# Patient Record
Sex: Male | Born: 1980 | Race: Black or African American | Hispanic: No | Marital: Single | Smoking: Current every day smoker
Health system: Southern US, Community
[De-identification: ages and names within clinical notes are randomized; demographics above are authoritative.]

## PROBLEM LIST (undated history)

## (undated) DIAGNOSIS — I509 Heart failure, unspecified: Secondary | ICD-10-CM

## (undated) DIAGNOSIS — I2510 Atherosclerotic heart disease of native coronary artery without angina pectoris: Secondary | ICD-10-CM

## (undated) DIAGNOSIS — J45909 Unspecified asthma, uncomplicated: Secondary | ICD-10-CM

## (undated) DIAGNOSIS — F1110 Opioid abuse, uncomplicated: Secondary | ICD-10-CM

## (undated) DIAGNOSIS — R0602 Shortness of breath: Secondary | ICD-10-CM

## (undated) DIAGNOSIS — R079 Chest pain, unspecified: Secondary | ICD-10-CM

## (undated) DIAGNOSIS — F1410 Cocaine abuse, uncomplicated: Secondary | ICD-10-CM

## (undated) DIAGNOSIS — Z720 Tobacco use: Secondary | ICD-10-CM

## (undated) HISTORY — DX: Shortness of breath: R06.02

## (undated) HISTORY — PX: HAND SURGERY: SHX662

## (undated) HISTORY — DX: Chest pain, unspecified: R07.9

## (undated) HISTORY — DX: Cocaine abuse, uncomplicated: F14.10

## (undated) HISTORY — DX: Heart failure, unspecified: I50.9

## (undated) HISTORY — DX: Atherosclerotic heart disease of native coronary artery without angina pectoris: I25.10

## (undated) HISTORY — DX: Tobacco use: Z72.0

---

## 2010-08-31 ENCOUNTER — Emergency Department (HOSPITAL_COMMUNITY)
Admission: EM | Admit: 2010-08-31 | Discharge: 2010-08-31 | Disposition: A | Discharge status: Home / Self Care | Payer: Self-pay | Attending: Emergency Medicine | Admitting: Emergency Medicine

## 2010-08-31 DIAGNOSIS — R002 Palpitations: Secondary | ICD-10-CM | POA: Insufficient documentation

## 2010-08-31 DIAGNOSIS — F411 Generalized anxiety disorder: Secondary | ICD-10-CM | POA: Insufficient documentation

## 2010-08-31 LAB — POCT I-STAT, CHEM 8
Calcium, Ion: 1.22 mmol/L (ref 1.12–1.32)
Glucose, Bld: 94 mg/dL (ref 70–99)
HCT: 48 % (ref 39.0–52.0)
TCO2: 29 mmol/L (ref 0–100)

## 2010-08-31 LAB — TROPONIN I: Troponin I: <0.3 ng/mL (ref <0.30)

## 2010-08-31 LAB — CK TOTAL AND CKMB (NOT AT ARMC): Total CK: 274 U/L — ABNORMAL HIGH (ref 7–232)

## 2013-01-15 ENCOUNTER — Encounter (HOSPITAL_COMMUNITY): Payer: Self-pay | Admitting: Emergency Medicine

## 2013-01-15 DIAGNOSIS — F17200 Nicotine dependence, unspecified, uncomplicated: Secondary | ICD-10-CM | POA: Insufficient documentation

## 2013-01-15 DIAGNOSIS — R0989 Other specified symptoms and signs involving the circulatory and respiratory systems: Secondary | ICD-10-CM | POA: Insufficient documentation

## 2013-01-15 DIAGNOSIS — R0602 Shortness of breath: Secondary | ICD-10-CM | POA: Insufficient documentation

## 2013-01-15 DIAGNOSIS — J45909 Unspecified asthma, uncomplicated: Secondary | ICD-10-CM | POA: Insufficient documentation

## 2013-01-15 DIAGNOSIS — B353 Tinea pedis: Secondary | ICD-10-CM | POA: Insufficient documentation

## 2013-01-15 DIAGNOSIS — R0609 Other forms of dyspnea: Secondary | ICD-10-CM | POA: Insufficient documentation

## 2013-01-15 NOTE — ED Notes (Signed)
Pt. reports gastric reflux " burping a lot" with slight SOB onset yesterday , denies cough or congestion ,. No fever or chills. No nausea .

## 2013-01-16 ENCOUNTER — Emergency Department (HOSPITAL_COMMUNITY): Payer: Self-pay

## 2013-01-16 ENCOUNTER — Emergency Department (HOSPITAL_COMMUNITY)
Admission: EM | Admit: 2013-01-16 | Discharge: 2013-01-16 | Disposition: A | Discharge status: Home / Self Care | Payer: Self-pay | Attending: Emergency Medicine | Admitting: Emergency Medicine

## 2013-01-16 ENCOUNTER — Encounter (HOSPITAL_COMMUNITY): Payer: Self-pay | Admitting: Emergency Medicine

## 2013-01-16 DIAGNOSIS — R0600 Dyspnea, unspecified: Secondary | ICD-10-CM

## 2013-01-16 DIAGNOSIS — B353 Tinea pedis: Secondary | ICD-10-CM

## 2013-01-16 HISTORY — DX: Unspecified asthma, uncomplicated: J45.909

## 2013-01-16 LAB — CBC WITH DIFFERENTIAL/PLATELET
Basophils Absolute: 0.1 10*3/uL (ref 0.0–0.1)
Eosinophils Relative: 3 % (ref 0–5)
HCT: 42.2 % (ref 39.0–52.0)
Hemoglobin: 14.6 g/dL (ref 13.0–17.0)
Lymphocytes Relative: 45 % (ref 12–46)
MCHC: 34.6 g/dL (ref 30.0–36.0)
MCV: 94.4 fL (ref 78.0–100.0)
Monocytes Absolute: 0.8 10*3/uL (ref 0.1–1.0)
Monocytes Relative: 8 % (ref 3–12)
Neutro Abs: 4.1 10*3/uL (ref 1.7–7.7)
RDW: 12.7 % (ref 11.5–15.5)
WBC: 9.4 10*3/uL (ref 4.0–10.5)

## 2013-01-16 LAB — COMPREHENSIVE METABOLIC PANEL
BUN: 14 mg/dL (ref 6–23)
CO2: 23 mEq/L (ref 19–32)
Calcium: 9.1 mg/dL (ref 8.4–10.5)
Chloride: 103 mEq/L (ref 96–112)
Creatinine, Ser: 1.17 mg/dL (ref 0.50–1.35)
GFR calc Af Amer: >90 mL/min (ref >90)
GFR calc non Af Amer: 81 mL/min — ABNORMAL LOW (ref >90)
Total Bilirubin: 0.2 mg/dL — ABNORMAL LOW (ref 0.3–1.2)

## 2013-01-16 LAB — LIPASE, BLOOD: Lipase: 39 U/L (ref 11–59)

## 2013-01-16 MED ORDER — FLUCONAZOLE 150 MG PO TABS: 150.0000 mg | ORAL_TABLET | ORAL | Status: DC

## 2013-01-16 MED ORDER — PANTOPRAZOLE SODIUM 20 MG PO TBEC: 20.0000 mg | DELAYED_RELEASE_TABLET | Freq: Every day | ORAL | Status: DC

## 2013-01-16 NOTE — ED Provider Notes (Signed)
CSN: [id number]     Arrival date & time 01/15/13  2330 History   First MD Initiated Contact with Patient 01/16/13 0243     Chief Complaint  Patient presents with  . Gastrophageal Reflux   (Consider location/radiation/quality/duration/timing/severity/associated sxs/prior Treatment) HPI Patient presents with several days of "burping a lot". States he is waking up frequently with shortness of breath and gasping for air. This happens mostly at night. He has no fevers or chills the denies any nausea or vomiting. Patient denies any recent NSAID use he does occasionally drink alcohol. He has had no dietary changes recently. Denies any cough, nasal congestion. He denies lower extremity swelling or pain. Past Medical History  Diagnosis Date  . Asthma    Past Surgical History  Procedure Laterality Date  . Hand surgery     History reviewed. No pertinent family history. History  Substance Use Topics  . Smoking status: Current Every Day Smoker -- 0.50 packs/day    Types: Cigarettes  . Smokeless tobacco: Former Neurosurgeon  . Alcohol Use: Yes    Review of Systems  Constitutional: Negative for fever and chills.  Respiratory: Positive for shortness of breath. Negative for cough.   Cardiovascular: Negative for chest pain, palpitations and leg swelling.  Gastrointestinal: Negative for nausea, vomiting, abdominal pain and diarrhea.  Genitourinary: Negative for dysuria and frequency.  Musculoskeletal: Negative for back pain, myalgias and neck stiffness.  Skin: Negative for rash and wound.  Neurological: Negative for dizziness, weakness, light-headedness, numbness and headaches.  All other systems reviewed and are negative.    Allergies  Review of patient's allergies indicates no known allergies.  Home Medications   Current Outpatient Rx  Name  Route  Sig  Dispense  Refill  . fluconazole (DIFLUCAN) 150 MG tablet   Oral   Take 1 tablet (150 mg total) by mouth once a week.   5 tablet   0   .  pantoprazole (PROTONIX) 20 MG tablet   Oral   Take 1 tablet (20 mg total) by mouth daily.   30 tablet   0    BP 134/82  Pulse 56  Temp(Src) 97.4 F (36.3 C) (Oral)  Resp 18  SpO2 100% Physical Exam  Nursing note and vitals reviewed. Constitutional: He is oriented to person, place, and time. He appears well-developed and well-nourished. No distress.  HENT:  Head: Normocephalic and atraumatic.  Mouth/Throat: Oropharynx is clear and moist.  Eyes: EOM are normal. Pupils are equal, round, and reactive to light.  Neck: Normal range of motion. Neck supple.  Cardiovascular: Normal rate and regular rhythm.   Pulmonary/Chest: Effort normal and breath sounds normal. No respiratory distress. He has no wheezes. He has no rales. He exhibits no tenderness.  Abdominal: Soft. Bowel sounds are normal. He exhibits no distension and no mass. There is no tenderness. There is no rebound and no guarding.  Musculoskeletal: Normal range of motion. He exhibits no edema and no tenderness.  Patient has no calf swelling or tenderness.  Neurological: He is alert and oriented to person, place, and time.  Skin: Skin is warm and dry. Rash noted. No erythema.  Rash consistent with tinea pedis to the right lateral foot.  Psychiatric: He has a normal mood and affect. His behavior is normal.    ED Course  Procedures (including critical care time) Labs Review Labs Reviewed  CBC WITH DIFFERENTIAL - Abnormal; Notable for the following:    Lymphs Abs 4.2 (*)    All other components within  normal limits  COMPREHENSIVE METABOLIC PANEL - Abnormal; Notable for the following:    Glucose, Bld 102 (*)    Total Bilirubin 0.2 (*)    GFR calc non Af Amer 81 (*)    All other components within normal limits  LIPASE, BLOOD   Imaging Review Dg Chest 2 View  01/16/2013   CLINICAL DATA:  Chest discomfort. Gastroesophageal reflux.  EXAM: CHEST  2 VIEW  COMPARISON:  None available for comparison at time of study  interpretation.  FINDINGS: Cardiomediastinal silhouette is unremarkable. The lungs are clear without pleural effusions or focal consolidations. Pulmonary vasculature is unremarkable. Trachea projects midline and there is no pneumothorax. Soft tissue planes and included osseous structures are nonsuspicious.  IMPRESSION: No active cardiopulmonary disease.   Electronically Signed   By: Awilda Metro   On: 01/16/2013 03:26    EKG Interpretation   None       MDM   1. Paroxysmal dyspnea   2. Tinea pedis of right foot     Aspect patient symptoms are likely due to gastroesophageal reflux. Will start on PPI and have advised small meals, sleepy with several pillows, eating well before going to sleep. If patient's symptoms do not improve he may need followup with gastroenterologist this been provided. Patient has been instructed to return for worsening symptoms or any concerns.   Loren Racer, MD 01/16/13 [phone]

## 2013-03-01 ENCOUNTER — Encounter (HOSPITAL_COMMUNITY): Payer: Self-pay | Admitting: Emergency Medicine

## 2013-03-01 ENCOUNTER — Emergency Department (HOSPITAL_COMMUNITY)
Admission: EM | Admit: 2013-03-01 | Discharge: 2013-03-02 | Disposition: A | Discharge status: Home / Self Care | Payer: Self-pay | Attending: Emergency Medicine | Admitting: Emergency Medicine

## 2013-03-01 DIAGNOSIS — E860 Dehydration: Secondary | ICD-10-CM | POA: Insufficient documentation

## 2013-03-01 DIAGNOSIS — R079 Chest pain, unspecified: Secondary | ICD-10-CM | POA: Insufficient documentation

## 2013-03-01 DIAGNOSIS — R0989 Other specified symptoms and signs involving the circulatory and respiratory systems: Secondary | ICD-10-CM | POA: Insufficient documentation

## 2013-03-01 DIAGNOSIS — R0609 Other forms of dyspnea: Secondary | ICD-10-CM | POA: Insufficient documentation

## 2013-03-01 DIAGNOSIS — F411 Generalized anxiety disorder: Secondary | ICD-10-CM | POA: Insufficient documentation

## 2013-03-01 DIAGNOSIS — F410 Panic disorder [episodic paroxysmal anxiety] without agoraphobia: Secondary | ICD-10-CM

## 2013-03-01 DIAGNOSIS — R0600 Dyspnea, unspecified: Secondary | ICD-10-CM

## 2013-03-01 DIAGNOSIS — F17200 Nicotine dependence, unspecified, uncomplicated: Secondary | ICD-10-CM | POA: Insufficient documentation

## 2013-03-01 DIAGNOSIS — J45901 Unspecified asthma with (acute) exacerbation: Secondary | ICD-10-CM | POA: Insufficient documentation

## 2013-03-01 MED ORDER — LORAZEPAM 2 MG/ML IJ SOLN
1.0000 mg | Freq: Once | INTRAMUSCULAR | Status: AC
  Administered 2013-03-02: 1 mg via INTRAVENOUS
  Filled 2013-03-01: qty 1

## 2013-03-01 NOTE — ED Notes (Signed)
Copy of EKG handed to Dr.Wickline

## 2013-03-01 NOTE — ED Notes (Signed)
Pt states he is having some SOB and "feels like i am having aheart attack."  Pt very anxious and admits to drinking atleast 4 beers tonight and smoke "alot of weed."

## 2013-03-02 ENCOUNTER — Other Ambulatory Visit: Payer: Self-pay

## 2013-03-02 ENCOUNTER — Emergency Department (HOSPITAL_COMMUNITY): Payer: Self-pay

## 2013-03-02 LAB — BASIC METABOLIC PANEL
CO2: 23 mEq/L (ref 19–32)
Chloride: 104 mEq/L (ref 96–112)
Creatinine, Ser: 1.42 mg/dL — ABNORMAL HIGH (ref 0.50–1.35)
GFR calc Af Amer: 74 mL/min — ABNORMAL LOW (ref >90)
Potassium: 3.5 mEq/L (ref 3.5–5.1)
Sodium: 142 mEq/L (ref 135–145)

## 2013-03-02 LAB — POCT I-STAT, CHEM 8
BUN: 16 mg/dL (ref 6–23)
Chloride: 108 mEq/L (ref 96–112)
Potassium: 3.8 mEq/L (ref 3.5–5.1)
Sodium: 144 mEq/L (ref 135–145)
TCO2: 23 mmol/L (ref 0–100)

## 2013-03-02 LAB — CBC
Platelets: 381 10*3/uL (ref 150–400)
RBC: 4.66 MIL/uL (ref 4.22–5.81)
RDW: 13.1 % (ref 11.5–15.5)
WBC: 10.5 10*3/uL (ref 4.0–10.5)

## 2013-03-02 LAB — POCT I-STAT TROPONIN I: Troponin i, poc: 0.02 ng/mL (ref 0.00–0.08)

## 2013-03-02 MED ORDER — SODIUM CHLORIDE 0.9 % IV BOLUS (SEPSIS)
1000.0000 mL | Freq: Once | INTRAVENOUS | Status: AC
  Administered 2013-03-02: 1000 mL via INTRAVENOUS

## 2013-03-02 MED ORDER — ONDANSETRON HCL 4 MG/2ML IJ SOLN
INTRAMUSCULAR | Status: AC
  Administered 2013-03-02: 4 mg via INTRAVENOUS
  Filled 2013-03-02: qty 2

## 2013-03-02 MED ORDER — ONDANSETRON HCL 4 MG/2ML IJ SOLN
4.0000 mg | Freq: Once | INTRAMUSCULAR | Status: AC
  Administered 2013-03-02: 4 mg via INTRAVENOUS

## 2013-03-02 MED ORDER — LORAZEPAM 2 MG/ML IJ SOLN
1.0000 mg | Freq: Once | INTRAMUSCULAR | Status: AC
  Administered 2013-03-02: 1 mg via INTRAVENOUS

## 2013-03-02 NOTE — ED Notes (Signed)
Visitor at [facility], pt awake and interactive, calm with intermittant panic. Venturi mask in place.

## 2013-03-02 NOTE — ED Notes (Signed)
Family at bedside. 

## 2013-03-02 NOTE — ED Notes (Signed)
Pt speaking with registration, cooperative, polite, answering questions, intermittantly dozes off during conversation, startles self and starts hyperventilating, resps/RR ranging from 8-40, LS CTA.

## 2013-03-02 NOTE — ED Notes (Signed)
Attempted to arouse pt, sleeping on RA with SPO2 96%, not arousable to physical or verbal stimuli, arousable to ammonia capsule (gently), interactive, but unable to maintain conversation, answers some questions and falls back to sleep, EDP aware. Family at [facility]. Plan explained, pending alertness.

## 2013-03-02 NOTE — ED Notes (Signed)
No changes, sleeping, VSS.

## 2013-03-02 NOTE — ED Notes (Signed)
Dr. Bebe Shaggy at [facility], pt panicky, anxious, mild hyperventilation, c/o belching, sob, coughing, gasping, pt mentions acid reflux, also mentions smoking marijuana & ETOH, LS CTA, no stridor, noisy forced inspiration.

## 2013-03-02 NOTE — ED Provider Notes (Signed)
CSN: [id number]     Arrival date & time 03/01/13  2338 History   First MD Initiated Contact with Patient 03/01/13 2348     Chief Complaint  Patient presents with  . Shortness of Breath    Patient is a 32 y.o. male presenting with shortness of breath. The history is provided by the patient.  Shortness of Breath Severity:  Moderate Onset quality:  Gradual Duration: several hours. Timing:  Constant Progression:  Worsening Chronicity:  New Context comment:  Drinking ETOH and smoking THC Relieved by:  Nothing Worsened by:  Nothing tried Associated symptoms: chest pain   Associated symptoms: no fever and no vomiting    Pt reports he has been drinking beer tonight and smoking marijuana He reports soon after he developed shortness of breath, frequent belching and anxiety He also reports mild CP He denies cocaine abuse  Past Medical History  Diagnosis Date  . Asthma    Past Surgical History  Procedure Laterality Date  . Hand surgery     History reviewed. No pertinent family history. History  Substance Use Topics  . Smoking status: Current Every Day Smoker -- 0.50 packs/day    Types: Cigarettes  . Smokeless tobacco: Former Neurosurgeon  . Alcohol Use: Yes    Review of Systems  Constitutional: Negative for fever.  Respiratory: Positive for shortness of breath.   Cardiovascular: Positive for chest pain.  Gastrointestinal: Negative for vomiting.  Psychiatric/Behavioral: The patient is nervous/anxious.   All other systems reviewed and are negative.    Allergies  Review of patient's allergies indicates no known allergies.  Home Medications   Current Outpatient Rx  Name  Route  Sig  Dispense  Refill  . fluconazole (DIFLUCAN) 150 MG tablet   Oral   Take 1 tablet (150 mg total) by mouth once a week.   5 tablet   0   . pantoprazole (PROTONIX) 20 MG tablet   Oral   Take 1 tablet (20 mg total) by mouth daily.   30 tablet   0   BP 129/65  Pulse 68  Resp 31  SpO2  100%  Physical Exam CONSTITUTIONAL: Well developed/well nourished, anxious HEAD: Normocephalic/atraumatic EYES: EOMI/PERRL ENMT: Mucous membranes moist NECK: supple no meningeal signs SPINE:entire spine nontender CV: S1/S2 noted, no murmurs/rubs/gallops noted LUNGS: Lungs are clear to auscultation bilaterally, no apparent distress ABDOMEN: soft, nontender, no rebound or guarding GU:no cva tenderness NEURO: Pt is awake/alert, moves all extremitiesx4 EXTREMITIES: pulses normal, full ROM SKIN: warm, color normal PSYCH: anxious  ED Course  Procedures   12:06 AM Pt reports feeling anxious and SOB since drug use tonight His EKG does not reveal acute issue, will check CXR and labs.  He denies cocaine use Will follow closely 1:48 AM Pt now resting comfortably, vitals stable 5:43 AM Pt slept through the night Now feels improved He is awake/alert, no distress, ambulatory I doubt ACS/PE given history/exam and his current appearance Labs Review Labs Reviewed  CBC  BASIC METABOLIC PANEL   Imaging Review No results found.  EKG Interpretation    Date/Time:  Friday March 01 2013 23:40:23 EST Ventricular Rate:  88 PR Interval:  130 QRS Duration: 104 QT Interval:  348 QTC Calculation: 421 R Axis:   66 Text Interpretation:  Normal sinus rhythm Normal ECG artifact noted Confirmed by Bebe Shaggy  MD, Christoher Drudge [id number]) on 03/01/2013 11:49:54 PM            MDM  No diagnosis found. Nursing notes including past medical  history and social history reviewed and considered in documentation xrays reviewed and considered Labs/vital reviewed and considered     Joya Gaskins, MD 03/02/13 [phone]

## 2013-03-02 NOTE — ED Notes (Signed)
Pt resting/sleeping with venti mask in place, VSS, family at [facility], pt calmer.

## 2013-03-02 NOTE — ED Notes (Signed)
Visitor at [facility], pt removed mask from face, pt sleeping, arousable to voice. SPO2 100% RA.

## 2016-01-24 ENCOUNTER — Emergency Department (HOSPITAL_COMMUNITY)
Admission: EM | Admit: 2016-01-24 | Discharge: 2016-01-24 | Disposition: A | Discharge status: Home / Self Care | Payer: Self-pay | Attending: Emergency Medicine | Admitting: Emergency Medicine

## 2016-01-24 ENCOUNTER — Encounter (HOSPITAL_COMMUNITY): Payer: Self-pay | Admitting: *Deleted

## 2016-01-24 DIAGNOSIS — F17210 Nicotine dependence, cigarettes, uncomplicated: Secondary | ICD-10-CM | POA: Insufficient documentation

## 2016-01-24 DIAGNOSIS — K047 Periapical abscess without sinus: Secondary | ICD-10-CM | POA: Insufficient documentation

## 2016-01-24 DIAGNOSIS — J45909 Unspecified asthma, uncomplicated: Secondary | ICD-10-CM | POA: Insufficient documentation

## 2016-01-24 MED ORDER — HYDROCODONE-ACETAMINOPHEN 5-325 MG PO TABS: 1.0000 | ORAL_TABLET | Freq: Four times a day (QID) | ORAL | 0 refills | Status: DC | PRN

## 2016-01-24 MED ORDER — PENICILLIN V POTASSIUM 500 MG PO TABS: 500.0000 mg | ORAL_TABLET | Freq: Three times a day (TID) | ORAL | 0 refills | Status: DC

## 2016-01-24 NOTE — ED Triage Notes (Signed)
Approx 2 months ago he broke a tooth on the top right and it cracked.  He pulled part of the tooth out but his face has begun to swell

## 2016-01-24 NOTE — ED Notes (Signed)
Patient is alert and orientedx4.  Patient was explained discharge instructions and they understood them with no questions.   

## 2016-01-24 NOTE — ED Provider Notes (Signed)
[facility]-EMERGENCY DEPT Provider Note   CSN: [id number] Arrival date & time: 01/24/16  0606     History   Chief Complaint Chief Complaint  Patient presents with  . Dental Pain    HPI Joe Miranda is a 35 y.o. male.  Patient is a 35 year old male with no significant past medical history. He presents for evaluation of facial swelling and dental pain that started 2 days ago. He reports fracturing a tooth about 6 weeks ago. He denies any fevers or chills. He denies any difficulty swallowing or breathing.   The history is provided by the patient.  Dental Pain   This is a new problem. The current episode started 2 days ago. The problem occurs constantly. The problem has been gradually worsening. The pain is moderate. He has tried nothing for the symptoms.    Past Medical History:  Diagnosis Date  . Asthma     There are no active problems to display for this patient.   Past Surgical History:  Procedure Laterality Date  . HAND SURGERY         Home Medications    Prior to Admission medications   Medication Sig Start Date End Date Taking? Authorizing Provider  omeprazole (PRILOSEC) 20 MG capsule Take 20 mg by mouth daily.    Historical Provider, MD    Family History No family history on file.  Social History Social History  Substance Use Topics  . Smoking status: Current Every Day Smoker    Packs/day: 0.50    Types: Cigarettes  . Smokeless tobacco: Former NeurosurgeonUser  . Alcohol use Yes     Allergies   Review of patient's allergies indicates no known allergies.   Review of Systems Review of Systems  All other systems reviewed and are negative.    Physical Exam Updated Vital Signs BP 148/92 (BP Location: Right Arm)   Pulse (!) 56   Temp 98.1 F (36.7 C) (Oral)   Resp 18   Ht 5\' 10"  (1.778 m)   Wt 195 lb (88.5 kg)   SpO2 98%   BMI 27.98 kg/m   Physical Exam  Constitutional: He appears well-developed and well-nourished. No distress.  HENT:  Head:  Normocephalic and atraumatic.  Mouth/Throat: Oropharynx is clear and moist.  There is a missing right upper bicuspid area there is facial swelling and tenderness to the cheek.  Neck: Normal range of motion. Neck supple.  Pulmonary/Chest: Effort normal.  Skin: Skin is warm and dry. He is not diaphoretic.  Nursing note and vitals reviewed.    ED Treatments / Results  Labs (all labs ordered are listed, but only abnormal results are displayed) Labs Reviewed - No data to display  EKG  EKG Interpretation None       Radiology No results found.  Procedures Procedures (including critical care time)  Medications Ordered in ED Medications - No data to display   Initial Impression / Assessment and Plan / ED Course  I have reviewed the triage vital signs and the nursing notes.  Pertinent labs & imaging results that were available during my care of the patient were reviewed by me and considered in my medical decision making (see chart for details).  Clinical Course    Will treat with antibiotics, pain meds, follow up with dentistry.  Final Clinical Impressions(s) / ED Diagnoses   Final diagnoses:  None    New Prescriptions New Prescriptions   No medications on file     Geoffery Lyonsouglas Iysis Germain, MD 01/24/16 [phone]

## 2021-02-28 ENCOUNTER — Emergency Department (HOSPITAL_COMMUNITY)
Admission: EM | Admit: 2021-02-28 | Discharge: 2021-03-01 | Disposition: A | Discharge status: Home / Self Care | Payer: 59 | Attending: Emergency Medicine | Admitting: Emergency Medicine

## 2021-02-28 ENCOUNTER — Other Ambulatory Visit: Payer: Self-pay

## 2021-02-28 ENCOUNTER — Encounter (HOSPITAL_COMMUNITY): Payer: Self-pay | Admitting: Emergency Medicine

## 2021-02-28 DIAGNOSIS — R079 Chest pain, unspecified: Secondary | ICD-10-CM | POA: Diagnosis present

## 2021-02-28 DIAGNOSIS — Z5321 Procedure and treatment not carried out due to patient leaving prior to being seen by health care provider: Secondary | ICD-10-CM | POA: Insufficient documentation

## 2021-02-28 LAB — BASIC METABOLIC PANEL
Anion gap: 8 (ref 5–15)
BUN: 13 mg/dL (ref 6–20)
CO2: 28 mmol/L (ref 22–32)
Calcium: 9.3 mg/dL (ref 8.9–10.3)
Chloride: 106 mmol/L (ref 98–111)
Creatinine, Ser: 1.19 mg/dL (ref 0.61–1.24)
GFR, Estimated: >60 mL/min (ref >60)
Glucose, Bld: 99 mg/dL (ref 70–99)
Potassium: 4.1 mmol/L (ref 3.5–5.1)
Sodium: 142 mmol/L (ref 135–145)

## 2021-02-28 LAB — CBC
HCT: 45.1 % (ref 39.0–52.0)
Hemoglobin: 15.1 g/dL (ref 13.0–17.0)
MCH: 32.5 pg (ref 26.0–34.0)
MCHC: 33.5 g/dL (ref 30.0–36.0)
MCV: 97.2 fL (ref 80.0–100.0)
Platelets: 510 10*3/uL — ABNORMAL HIGH (ref 150–400)
RBC: 4.64 MIL/uL (ref 4.22–5.81)
RDW: 13.4 % (ref 11.5–15.5)
WBC: 10.6 10*3/uL — ABNORMAL HIGH (ref 4.0–10.5)
nRBC: 0 % (ref 0.0–0.2)

## 2021-02-28 LAB — TROPONIN I (HIGH SENSITIVITY): Troponin I (High Sensitivity): 99 ng/L — ABNORMAL HIGH (ref <18)

## 2021-02-28 NOTE — ED Triage Notes (Signed)
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

## 2021-02-28 NOTE — ED Provider Notes (Signed)
Emergency Medicine Provider Triage Evaluation Note  Jameire Kouba , a 40 y.o. male  was evaluated in triage.  Visit patient presents today after having intermittent chest pains that started earlier today.  Says it is in the middle of his chest and feels like pressure.  He has no other associated symptoms.  His episodes last about 7 minutes each and then go away and he feels back to normal.  He has had multiple episodes like this today.  The last one happened about 5 minutes ago.  He has no medical history other than asthma.  He also complains that he thinks the bone on his sternum is more raised than it should be.  Review of Systems  Positive:  Negative: See above  Physical Exam  BP (!) 154/104   Pulse 78   Temp 98 F (36.7 C) (Oral)   Resp 18   SpO2 99%  Gen:   Awake, no distress   Resp:  Normal effort  MSK:   Moves extremities without difficulty  Other:  Nonreproducible tenderness to palpation of anterior chest wall.  Medical Decision Making  Medically screening exam initiated at 10:16 PM.  Appropriate orders placed.  Vong [place] was informed that the remainder of the evaluation will be completed by another provider, this initial triage assessment does not replace that evaluation, and the importance of remaining in the ED until their evaluation is complete.     Therese Sarah 02/28/21 2217    Charlynne Pander, MD 02/28/21 [phone]

## 2021-02-28 NOTE — ED Notes (Signed)
Pt called for vitals and xray no answer.

## 2021-04-13 ENCOUNTER — Inpatient Hospital Stay (HOSPITAL_COMMUNITY)
Admission: EM | Discharge status: Against Medical Advice | Payer: Self-pay | Source: Home / Self Care | Attending: Pulmonary Disease

## 2021-04-13 ENCOUNTER — Emergency Department (HOSPITAL_COMMUNITY): Payer: 59

## 2021-04-13 ENCOUNTER — Encounter (HOSPITAL_COMMUNITY): Payer: Self-pay | Admitting: Emergency Medicine

## 2021-04-13 ENCOUNTER — Inpatient Hospital Stay (HOSPITAL_COMMUNITY)
Admission: EM | Admit: 2021-04-13 | Discharge: 2021-04-15 | DRG: 246 | Discharge status: Against Medical Advice | Payer: 59 | Attending: Pulmonary Disease | Admitting: Pulmonary Disease

## 2021-04-13 DIAGNOSIS — I462 Cardiac arrest due to underlying cardiac condition: Secondary | ICD-10-CM | POA: Diagnosis present

## 2021-04-13 DIAGNOSIS — I493 Ventricular premature depolarization: Secondary | ICD-10-CM | POA: Diagnosis present

## 2021-04-13 DIAGNOSIS — I2510 Atherosclerotic heart disease of native coronary artery without angina pectoris: Secondary | ICD-10-CM

## 2021-04-13 DIAGNOSIS — J9601 Acute respiratory failure with hypoxia: Secondary | ICD-10-CM | POA: Diagnosis present

## 2021-04-13 DIAGNOSIS — I2102 ST elevation (STEMI) myocardial infarction involving left anterior descending coronary artery: Secondary | ICD-10-CM | POA: Diagnosis not present

## 2021-04-13 DIAGNOSIS — I5041 Acute combined systolic (congestive) and diastolic (congestive) heart failure: Secondary | ICD-10-CM | POA: Diagnosis present

## 2021-04-13 DIAGNOSIS — N179 Acute kidney failure, unspecified: Secondary | ICD-10-CM | POA: Diagnosis present

## 2021-04-13 DIAGNOSIS — F1110 Opioid abuse, uncomplicated: Secondary | ICD-10-CM | POA: Diagnosis present

## 2021-04-13 DIAGNOSIS — R52 Pain, unspecified: Secondary | ICD-10-CM

## 2021-04-13 DIAGNOSIS — W19XXXA Unspecified fall, initial encounter: Secondary | ICD-10-CM | POA: Diagnosis present

## 2021-04-13 DIAGNOSIS — Z20822 Contact with and (suspected) exposure to covid-19: Secondary | ICD-10-CM | POA: Diagnosis present

## 2021-04-13 DIAGNOSIS — J45909 Unspecified asthma, uncomplicated: Secondary | ICD-10-CM | POA: Diagnosis present

## 2021-04-13 DIAGNOSIS — Z79899 Other long term (current) drug therapy: Secondary | ICD-10-CM

## 2021-04-13 DIAGNOSIS — I5189 Other ill-defined heart diseases: Secondary | ICD-10-CM | POA: Diagnosis present

## 2021-04-13 DIAGNOSIS — Z781 Physical restraint status: Secondary | ICD-10-CM

## 2021-04-13 DIAGNOSIS — K7200 Acute and subacute hepatic failure without coma: Secondary | ICD-10-CM | POA: Diagnosis present

## 2021-04-13 DIAGNOSIS — R451 Restlessness and agitation: Secondary | ICD-10-CM | POA: Diagnosis present

## 2021-04-13 DIAGNOSIS — I4901 Ventricular fibrillation: Secondary | ICD-10-CM | POA: Diagnosis present

## 2021-04-13 DIAGNOSIS — F05 Delirium due to known physiological condition: Secondary | ICD-10-CM | POA: Diagnosis present

## 2021-04-13 DIAGNOSIS — F17210 Nicotine dependence, cigarettes, uncomplicated: Secondary | ICD-10-CM | POA: Diagnosis present

## 2021-04-13 DIAGNOSIS — I469 Cardiac arrest, cause unspecified: Secondary | ICD-10-CM | POA: Diagnosis present

## 2021-04-13 DIAGNOSIS — E872 Acidosis, unspecified: Secondary | ICD-10-CM | POA: Diagnosis present

## 2021-04-13 DIAGNOSIS — J9602 Acute respiratory failure with hypercapnia: Secondary | ICD-10-CM | POA: Diagnosis present

## 2021-04-13 DIAGNOSIS — R092 Respiratory arrest: Secondary | ICD-10-CM | POA: Diagnosis present

## 2021-04-13 DIAGNOSIS — R739 Hyperglycemia, unspecified: Secondary | ICD-10-CM | POA: Diagnosis present

## 2021-04-13 DIAGNOSIS — S0101XA Laceration without foreign body of scalp, initial encounter: Secondary | ICD-10-CM | POA: Diagnosis present

## 2021-04-13 DIAGNOSIS — Z955 Presence of coronary angioplasty implant and graft: Secondary | ICD-10-CM

## 2021-04-13 DIAGNOSIS — I213 ST elevation (STEMI) myocardial infarction of unspecified site: Secondary | ICD-10-CM | POA: Diagnosis present

## 2021-04-13 DIAGNOSIS — G931 Anoxic brain damage, not elsewhere classified: Secondary | ICD-10-CM | POA: Diagnosis present

## 2021-04-13 DIAGNOSIS — I519 Heart disease, unspecified: Secondary | ICD-10-CM | POA: Diagnosis present

## 2021-04-13 DIAGNOSIS — Z9861 Coronary angioplasty status: Secondary | ICD-10-CM

## 2021-04-13 HISTORY — DX: Opioid abuse, uncomplicated: F11.10

## 2021-04-13 HISTORY — PX: CORONARY/GRAFT ACUTE MI REVASCULARIZATION: CATH118305

## 2021-04-13 HISTORY — PX: LEFT HEART CATH AND CORONARY ANGIOGRAPHY: CATH118249

## 2021-04-13 LAB — I-STAT CHEM 8, ED
BUN: 18 mg/dL (ref 6–20)
Calcium, Ion: 1.13 mmol/L — ABNORMAL LOW (ref 1.15–1.40)
Chloride: 110 mmol/L (ref 98–111)
Creatinine, Ser: 1.4 mg/dL — ABNORMAL HIGH (ref 0.61–1.24)
Glucose, Bld: 249 mg/dL — ABNORMAL HIGH (ref 70–99)
HCT: 50 % (ref 39.0–52.0)
Hemoglobin: 17 g/dL (ref 13.0–17.0)
Potassium: 4 mmol/L (ref 3.5–5.1)
Sodium: 141 mmol/L (ref 135–145)
TCO2: 18 mmol/L — ABNORMAL LOW (ref 22–32)

## 2021-04-13 LAB — I-STAT VENOUS BLOOD GAS, ED
Acid-base deficit: 11 mmol/L — ABNORMAL HIGH (ref 0.0–2.0)
Bicarbonate: 16.9 mmol/L — ABNORMAL LOW (ref 20.0–28.0)
Calcium, Ion: 1.15 mmol/L (ref 1.15–1.40)
HCT: 47 % (ref 39.0–52.0)
Hemoglobin: 16 g/dL (ref 13.0–17.0)
O2 Saturation: 95 %
Potassium: 3.8 mmol/L (ref 3.5–5.1)
Sodium: 139 mmol/L (ref 135–145)
TCO2: 18 mmol/L — ABNORMAL LOW (ref 22–32)
pCO2, Ven: 45.8 mmHg (ref 44.0–60.0)
pH, Ven: 7.176 — CL (ref 7.250–7.430)
pO2, Ven: 95 mmHg — ABNORMAL HIGH (ref 32.0–45.0)

## 2021-04-13 LAB — I-STAT ARTERIAL BLOOD GAS, ED
Acid-base deficit: 5 mmol/L — ABNORMAL HIGH (ref 0.0–2.0)
Bicarbonate: 21 mmol/L (ref 20.0–28.0)
Calcium, Ion: 1.19 mmol/L (ref 1.15–1.40)
HCT: 49 % (ref 39.0–52.0)
Hemoglobin: 16.7 g/dL (ref 13.0–17.0)
O2 Saturation: 100 %
Patient temperature: 96.9
Potassium: 3.5 mmol/L (ref 3.5–5.1)
Sodium: 141 mmol/L (ref 135–145)
TCO2: 22 mmol/L (ref 22–32)
pCO2 arterial: 38.8 mmHg (ref 32.0–48.0)
pH, Arterial: 7.337 — ABNORMAL LOW (ref 7.350–7.450)
pO2, Arterial: 425 mmHg — ABNORMAL HIGH (ref 83.0–108.0)

## 2021-04-13 LAB — CBC WITH DIFFERENTIAL/PLATELET
Abs Immature Granulocytes: 0.52 10*3/uL — ABNORMAL HIGH (ref 0.00–0.07)
Basophils Absolute: 0.2 10*3/uL — ABNORMAL HIGH (ref 0.0–0.1)
Basophils Relative: 1 %
Eosinophils Absolute: 0.2 10*3/uL (ref 0.0–0.5)
Eosinophils Relative: 2 %
HCT: 50.5 % (ref 39.0–52.0)
Hemoglobin: 15.5 g/dL (ref 13.0–17.0)
Immature Granulocytes: 4 %
Lymphocytes Relative: 31 %
Lymphs Abs: 3.9 10*3/uL (ref 0.7–4.0)
MCH: 32 pg (ref 26.0–34.0)
MCHC: 30.7 g/dL (ref 30.0–36.0)
MCV: 104.1 fL — ABNORMAL HIGH (ref 80.0–100.0)
Monocytes Absolute: 1.3 10*3/uL — ABNORMAL HIGH (ref 0.1–1.0)
Monocytes Relative: 10 %
Neutro Abs: 6.7 10*3/uL (ref 1.7–7.7)
Neutrophils Relative %: 52 %
Platelets: 338 10*3/uL (ref 150–400)
RBC: 4.85 MIL/uL (ref 4.22–5.81)
RDW: 13.7 % (ref 11.5–15.5)
WBC: 12.8 10*3/uL — ABNORMAL HIGH (ref 4.0–10.5)
nRBC: 0 % (ref 0.0–0.2)

## 2021-04-13 LAB — RESP PANEL BY RT-PCR (FLU A&B, COVID) ARPGX2
Influenza A by PCR: NEGATIVE
Influenza B by PCR: NEGATIVE
SARS Coronavirus 2 by RT PCR: NEGATIVE

## 2021-04-13 SURGERY — CORONARY/GRAFT ACUTE MI REVASCULARIZATION
Anesthesia: LOCAL

## 2021-04-13 MED ORDER — AMIODARONE HCL IN DEXTROSE 360-4.14 MG/200ML-% IV SOLN
30.0000 mg/h | INTRAVENOUS | Status: DC
  Administered 2021-04-14 – 2021-04-15 (×3): 30 mg/h via INTRAVENOUS
  Filled 2021-04-13 (×2): qty 200

## 2021-04-13 MED ORDER — AMIODARONE HCL IN DEXTROSE 360-4.14 MG/200ML-% IV SOLN
30.0000 mg/h | INTRAVENOUS | Status: DC
  Administered 2021-04-13 – 2021-04-14 (×2): 30 mg/h via INTRAVENOUS
  Filled 2021-04-13: qty 200

## 2021-04-13 MED ORDER — HEPARIN SODIUM (PORCINE) 1000 UNIT/ML IJ SOLN
4000.0000 [IU] | Freq: Once | INTRAMUSCULAR | Status: AC
  Administered 2021-04-13: 4000 [IU] via INTRAVENOUS
  Filled 2021-04-13: qty 4

## 2021-04-13 MED ORDER — ASPIRIN 300 MG RE SUPP
300.0000 mg | Freq: Once | RECTAL | Status: AC
Start: 2021-04-13 — End: 2021-04-13
  Administered 2021-04-13: 300 mg via RECTAL

## 2021-04-13 MED ORDER — MAGNESIUM SULFATE 2 GM/50ML IV SOLN
2.0000 g | Freq: Once | INTRAVENOUS | Status: AC
  Administered 2021-04-13: 2 g via INTRAVENOUS

## 2021-04-13 MED ORDER — ROCURONIUM BROMIDE 50 MG/5ML IV SOLN
INTRAVENOUS | Status: DC | PRN
  Administered 2021-04-13: 100 mg via INTRAVENOUS

## 2021-04-13 MED ORDER — ETOMIDATE 2 MG/ML IV SOLN
INTRAVENOUS | Status: DC | PRN
  Administered 2021-04-13: 20 mg via INTRAVENOUS

## 2021-04-13 MED ORDER — NITROGLYCERIN 1 MG/10 ML FOR IR/CATH LAB
INTRA_ARTERIAL | Status: AC
  Filled 2021-04-13: qty 10

## 2021-04-13 MED ORDER — FENTANYL 2500MCG IN NS 250ML (10MCG/ML) PREMIX INFUSION
0.0000 ug/h | INTRAVENOUS | Status: DC
  Administered 2021-04-13: 25 ug/h via INTRAVENOUS
  Filled 2021-04-13 (×2): qty 250

## 2021-04-13 MED ORDER — HEPARIN (PORCINE) IN NACL 1000-0.9 UT/500ML-% IV SOLN
INTRAVENOUS | Status: AC
  Filled 2021-04-13: qty 1000

## 2021-04-13 MED ORDER — AMIODARONE LOAD VIA INFUSION
300.0000 mg | Freq: Once | INTRAVENOUS | Status: AC
  Administered 2021-04-13: 300 mg via INTRAVENOUS

## 2021-04-13 MED ORDER — LIDOCAINE HCL (PF) 1 % IJ SOLN
INTRAMUSCULAR | Status: AC
  Filled 2021-04-13: qty 30

## 2021-04-13 SURGICAL SUPPLY — 19 items
BALLN SAPPHIRE 2.5X15 (BALLOONS) ×2
BALLN SAPPHIRE ~~LOC~~ 4.0X12 (BALLOONS) ×1 IMPLANT
BALLOON SAPPHIRE 2.5X15 (BALLOONS) IMPLANT
CATH INFINITI JR4 5F (CATHETERS) ×1 IMPLANT
CATH VISTA GUIDE 6FR XBLAD3.5 (CATHETERS) ×1 IMPLANT
DEVICE RAD COMP TR BAND LRG (VASCULAR PRODUCTS) ×1 IMPLANT
ELECT DEFIB PAD ADLT CADENCE (PAD) ×1 IMPLANT
GLIDESHEATH SLEND A-KIT 6F 22G (SHEATH) ×1 IMPLANT
GLIDESHEATH SLEND SS 6F .021 (SHEATH) IMPLANT
GUIDEWIRE INQWIRE 1.5J.035X260 (WIRE) IMPLANT
INQWIRE 1.5J .035X260CM (WIRE) ×2
KIT HEART LEFT (KITS) ×2 IMPLANT
PACK CARDIAC CATHETERIZATION (CUSTOM PROCEDURE TRAY) ×2 IMPLANT
SHEATH PROBE COVER 6X72 (BAG) ×1 IMPLANT
STENT SYNERGY XD 3.50X24 (Permanent Stent) IMPLANT
SYNERGY XD 3.50X24 (Permanent Stent) ×2 IMPLANT
TRANSDUCER W/STOPCOCK (MISCELLANEOUS) ×2 IMPLANT
TUBING CIL FLEX 10 FLL-RA (TUBING) ×2 IMPLANT
WIRE ASAHI PROWATER 180CM (WIRE) ×1 IMPLANT

## 2021-04-13 NOTE — ED Notes (Addendum)
Pt to CT with RN

## 2021-04-13 NOTE — ED Provider Notes (Addendum)
[facility] EMERGENCY DEPARTMENT Provider Note   CSN: [id number] Arrival date & time: 04/13/21  2259     History  No chief complaint on file.   Joe Miranda is a 41 y.o. male.  The history is provided by the EMS personnel. The history is limited by the condition of the patient.  Cardiac Arrest Cardiac arrest witnessed by: not witnessed but thud in other other room. Incident location:  Home Time since incident: minutes. Time before ALS initiated:  3-5 minutes Condition upon EMS arrival:  Agonal respirations Pulse:  Weak Initial cardiac rhythm per EMS:  Ventricular fibrillation Treatments prior to arrival:  ACLS protocol Medications given prior to ED:  Epinephrine Airway:  Bag valve mask Rhythm on admission to ED:  Sinus tachycardia Associated symptoms comment:  Unknown  Risk factors comment:  NSTEMI 2022 AMAed     Home Medications Prior to Admission medications   Medication Sig Start Date End Date Taking? Authorizing Provider  HYDROcodone-acetaminophen (NORCO) 5-325 MG tablet Take 1-2 tablets by mouth every 6 (six) hours as needed. 01/24/16   Geoffery Lyons, MD  omeprazole (PRILOSEC) 20 MG capsule Take 20 mg by mouth daily.    [provider]  penicillin v potassium (VEETID) 500 MG tablet Take 1 tablet (500 mg total) by mouth 3 (three) times daily. 01/24/16   Geoffery Lyons, MD      Allergies    Patient has no known allergies.    Review of Systems   Review of Systems  Unable to perform ROS: Acuity of condition  Constitutional:  Negative for fever.  HENT:  Negative for facial swelling.   Cardiovascular:  Negative for leg swelling.  Skin:  Negative for rash.   Physical Exam Updated Vital Signs BP 130/79    Pulse (!) 118    Resp 20    SpO2 100%  Physical Exam Vitals and nursing note reviewed.  Constitutional:      Appearance: He is diaphoretic.  HENT:     Head: Normocephalic.     Right Ear: Tympanic membrane normal.     Left Ear:  Tympanic membrane normal.     Nose: Nose normal.     Mouth/Throat:     Mouth: Mucous membranes are moist.  Eyes:     Conjunctiva/sclera: Conjunctivae normal.  Cardiovascular:     Rate and Rhythm: Regular rhythm. Tachycardia present.     Heart sounds: Normal heart sounds.  Abdominal:     General: Bowel sounds are normal.     Tenderness: There is no abdominal tenderness.  Musculoskeletal:     Cervical back: No rigidity.     Right lower leg: No edema.     Left lower leg: No edema.  Skin:    Capillary Refill: Capillary refill takes less than 2 seconds.  Neurological:     GCS: GCS eye subscore is 4. GCS verbal subscore is 1. GCS motor subscore is 4.     Deep Tendon Reflexes: Reflexes normal.    ED Results / Procedures / Treatments   Labs (all labs ordered are listed, but only abnormal results are displayed) Results for orders placed or performed during the hospital encounter of 04/13/21  I-stat chem 8, ed  Result Value Ref Range   Sodium 141 135 - 145 mmol/L   Potassium 4.0 3.5 - 5.1 mmol/L   Chloride 110 98 - 111 mmol/L   BUN 18 6 - 20 mg/dL   Creatinine, Ser 8.83 (H) 0.61 - 1.24 mg/dL  Glucose, Bld 249 (H) 70 - 99 mg/dL   Calcium, Ion 7.821.13 (L) 1.15 - 1.40 mmol/L   TCO2 18 (L) 22 - 32 mmol/L   Hemoglobin 17.0 13.0 - 17.0 g/dL   HCT 95.650.0 21.339.0 - 08.652.0 %   No results found.  EKG None  Radiology No results found.  Procedures Procedures    Medications Ordered in ED Medications  amiodarone (NEXTERONE) 1.8 mg/mL load via infusion 300 mg (300 mg Intravenous Bolus from Bag 04/13/21 2304)    Followed by  amiodarone (NEXTERONE PREMIX) 360-4.14 MG/200ML-% (1.8 mg/mL) IV infusion (30 mg/hr Intravenous New Bag/Given 04/13/21 2314)    Followed by  amiodarone (NEXTERONE PREMIX) 360-4.14 MG/200ML-% (1.8 mg/mL) IV infusion (has no administration in time range)  etomidate (AMIDATE) injection (20 mg Intravenous Given 04/13/21 2304)  rocuronium (ZEMURON) injection (100 mg Intravenous  Given 04/13/21 2304)  magnesium sulfate IVPB 2 g 50 mL (2 g Intravenous New Bag/Given 04/13/21 2315)    ED Course/ Medical Decision Making/ A&P                           Medical Decision Making Problems Addressed: Cardiac arrest [facility](HCC): acute illness or injury Respiratory arrest [facility](HCC): acute illness or injury ST elevation myocardial infarction (STEMI), unspecified artery (HCC): acute illness or injury  Amount and/or Complexity of Data Reviewed Independent Historian: EMS External Data Reviewed: labs, radiology and ECG.    Details: 1.4 creatinine Labs: ordered. Radiology: ordered. ECG/medicine tests: ordered and independent interpretation performed.  Risk OTC drugs. Prescription drug management. Decision regarding hospitalization.  Critical Care Total time providing critical care: 30-74 minutes  This patient presents to the ED for concern of STEMI, this involves an extensive number of treatment options, and is a complaint that carries with it a high risk of complications and morbidity.  The differential diagnosis includes STEMI and CHF   Co morbidities that complicate the patient evaluation  H/o NSTEMI   Additional history obtained:  Additional history obtained from EMS External records from outside source obtained and reviewed including past records    Lab Tests:  I Ordered, and personally interpreted labs.  The pertinent results include:  normal creatinine     Cardiac Monitoring:  The patient was maintained on a cardiac monitor.  I personally viewed and interpreted the cardiac monitored which showed an underlying rhythm of: Sinus tachycardia    Medicines ordered and prescription drug management:  I ordered medication including amiodaron and magnesium  Reevaluation of the patient after these medicines showed that the patient stayed the same I have reviewed the patients home medicines and have made adjustments as needed   Test Considered:  CT head secondary  to fall      Consultations Obtained:  I requested consultation with the Cardiology,  and discussed lab and imaging findings as well as pertinent plan - they recommend: cath post CT    Reevaluation:  After the interventions noted above, I reevaluated the patient and found that they have :stayed the same  MDM Reviewed: previous chart, nursing note and vitals Reviewed previous: labs Interpretation: labs, ECG and x-ray Total time providing critical care: 30-74 minutes. This excludes time spent performing separately reportable procedures and services. Consults: cardiology and critical care  CRITICAL CARE Performed by: Willamae Demby K Abbiegail Landgren-Rasch Total critical care time: 31 minutes Critical care time was exclusive of separately billable procedures and treating other patients. Critical care was necessary to treat or prevent imminent or life-threatening deterioration.  Critical care was time spent personally by me on the following activities: development of treatment plan with patient and/or surrogate as well as nursing, discussions with consultants, evaluation of patient's response to treatment, examination of patient, obtaining history from patient or surrogate, ordering and performing treatments and interventions, ordering and review of laboratory studies, ordering and review of radiographic studies, pulse oximetry and re-evaluation of patient's condition.   Plan to admit secondary to STEMI and critical illness   Final Clinical Impression(s) / ED Diagnoses Final diagnoses:  None    Rx / DC Orders ED Discharge Orders     None         Cortlin Marano, MD 04/13/21 6270    Nicanor Alcon, Latarshia Jersey, MD 04/14/21 0000

## 2021-04-13 NOTE — H&P (Signed)
NAME:  Joe Miranda, MRN:  [id number], DOB:  06-20-1980, LOS: 0 ADMISSION DATE:  04/13/2021, CONSULTATION DATE:  04/14/21 REFERRING MD:  Nicanor Alcon, CHIEF COMPLAINT:  cardiac arrest   History of Present Illness:  Joe Miranda is a 41 y.o. M with PMH of reported heroin abuse who was found unresponsive by his mother and in PEA arrest on EMS arrival.  Approximately 5-10 minutes before EMS arrival, received 15 minutes CPR, 1 epi and 3 defibrillations and converted to vfib. He was intubated in the ED, per report eyes were open but no purposeful movements.   EKG consistent with STEMI and pt to be taken to the cath lab. CT head negative. PCCM consulted for admission.   He was recently in the ED c/o chest pain, but left from the waiting room before he was evaluated.   Pertinent  Medical History  Heroin abuse   Significant Hospital Events: Including procedures, antibiotic start and stop dates in addition to other pertinent events   1/18 brought in by EMS post-arrest, intubated and taken to the cath lab  Interim History / Subjective:  Transferred from the cath lab to ICU in stable condition not requiring pressors  Objective   Blood pressure (!) 155/94, pulse (!) 101, temperature (!) 96.7 F (35.9 C), temperature source Temporal, resp. rate (!) 25, height (P) 5\' 9"  (1.753 m), SpO2 100 %.       No intake or output data in the 24 hours ending 04/14/21 0000 There were no vitals filed for this visit.  General:  well-nourished M, intubated and sedated HEENT: MM pink/moist, sclera anicteric, pupils equal  Neuro: examined on versed and fentanyl, some spontaneous movement of the extremities, nothing purposeful.  No myclonus CV: s1s2 rrr, no m/r/g PULM:  mechanical breath sounds bilaterally without significant rhonchi or wheezing, on full vent support, not triggering breath GI: soft, non-distended Extremities: warm/dry, no edema  Skin: no rashes or lesions   Resolved Hospital Problem list      Assessment & Plan:    Unwitnessed, out of hospital cardiac arrest Anterior STEMI Respiratory Failure secondary to arrest Likely aspiration PNA  Initially PEA, then Vfib. Approximately 15 minutes ACLS Taken to cath lab and found to have subtotal thrombotic occlusion of the prox-mid LAD s/p DES -cardiology consulted in the ED, received Asa and on aggrastat -continue Amiodarone -normothermia protocol, supportive care -EEG -echo -Maintain full vent support with SAT/SBT as tolerated -titrate Vent setting to maintain SpO2 greater than or equal to 90%. -HOB elevated 30 degrees. -Plateau pressures less than 30 cm H20.  -Follow chest x-ray, ABG prn.   -Bronchial hygiene and RT/bronchodilator protocol. -Unasyn for aspiration   Hyperglycemia -SSI   AKI Creatinine 1.59 on arrival, 1.19 one month  -monitor renal indices and UOP, at risk for worsening AKI in the setting of the above -avoid nephrotoxins as able   Elevated Transaminases  Likely secondary to shock liver -supportive care and follow   Reported history of substance use -recently heroin per family  Best Practice (right click and "Reselect all SmartList Selections" daily)   Diet/type: NPO DVT prophylaxis: other GI prophylaxis: PPI Lines: N/A Foley:  Yes, and it is still needed Code Status:  full code Last date of multidisciplinary goals of care discussion [family updated by cardiology]  Labs   CBC: Recent Labs  Lab 04/13/21 2304 04/13/21 2309 04/13/21 2321 04/13/21 2348  WBC 12.8*  --   --   --   NEUTROABS 6.7  --   --   --  HGB 15.5 17.0 16.0 16.7  HCT 50.5 50.0 47.0 49.0  MCV 104.1*  --   --   --   PLT 338  --   --   --     Basic Metabolic Panel: Recent Labs  Lab 04/13/21 2309 04/13/21 2321 04/13/21 2348  NA 141 139 141  K 4.0 3.8 3.5  CL 110  --   --   GLUCOSE 249*  --   --   BUN 18  --   --   CREATININE 1.40*  --   --    GFR: CrCl cannot be calculated (Unknown ideal  weight.). Recent Labs  Lab 04/13/21 2304  WBC 12.8*    Liver Function Tests: No results for input(s): AST, ALT, ALKPHOS, BILITOT, PROT, ALBUMIN in the last 168 hours. No results for input(s): LIPASE, AMYLASE in the last 168 hours. No results for input(s): AMMONIA in the last 168 hours.  ABG    Component Value Date/Time   PHART 7.337 (L) 04/13/2021 2348   PCO2ART 38.8 04/13/2021 2348   PO2ART 425 (H) 04/13/2021 2348   HCO3 21.0 04/13/2021 2348   TCO2 22 04/13/2021 2348   ACIDBASEDEF 5.0 (H) 04/13/2021 2348   O2SAT 100.0 04/13/2021 2348     Coagulation Profile: No results for input(s): INR, PROTIME in the last 168 hours.  Cardiac Enzymes: No results for input(s): CKTOTAL, CKMB, CKMBINDEX, TROPONINI in the last 168 hours.  HbA1C: No results found for: HGBA1C  CBG: No results for input(s): GLUCAP in the last 168 hours.  Review of Systems:   Unable to obtain as patient is intubated  Past Medical History:  He,  has a past medical history of Asthma.   Surgical History:   Past Surgical History:  Procedure Laterality Date   HAND SURGERY       Social History:   reports that he has been smoking cigarettes. He has been smoking an average of .5 packs per day. He has quit using smokeless tobacco. He reports current alcohol use. He reports current drug use. Drug: Marijuana.   Family History:  His family history is not on file.   Allergies No Known Allergies   Home Medications  Prior to Admission medications   Medication Sig Start Date End Date Taking? Authorizing Provider  HYDROcodone-acetaminophen (NORCO) 5-325 MG tablet Take 1-2 tablets by mouth every 6 (six) hours as needed. 01/24/16   Geoffery Lyons, MD  omeprazole (PRILOSEC) 20 MG capsule Take 20 mg by mouth daily.    [provider]  penicillin v potassium (VEETID) 500 MG tablet Take 1 tablet (500 mg total) by mouth 3 (three) times daily. 01/24/16   Geoffery Lyons, MD     Critical care time: 40  minutes     CRITICAL CARE Performed by: Darcella Gasman Fannie Alomar   Total critical care time: 40 minutes  Critical care time was exclusive of separately billable procedures and treating other patients.  Critical care was necessary to treat or prevent imminent or life-threatening deterioration.  Critical care was time spent personally by me on the following activities: development of treatment plan with patient and/or surrogate as well as nursing, discussions with consultants, evaluation of patient's response to treatment, examination of patient, obtaining history from patient or surrogate, ordering and performing treatments and interventions, ordering and review of laboratory studies, ordering and review of radiographic studies, pulse oximetry and re-evaluation of patient's condition.   Darcella Gasman Georganne Siple, PA-C [facility] See Amion for pager If no  response to pager , please call [phone] until 7pm After 7:00 pm call Elink  [phone]

## 2021-04-13 NOTE — ED Notes (Signed)
Pt intubated 26 at the lip

## 2021-04-13 NOTE — ED Provider Notes (Signed)
Procedure Name: Intubation Date/Time: 04/13/2021 11:12 PM Performed by: Lianne Cure, DO Pre-anesthesia Checklist: Patient identified, Patient being monitored, Timeout performed and Suction available Oxygen Delivery Method: Ambu bag Preoxygenation: Pre-oxygenation with 100% oxygen Induction Type: Rapid sequence Ventilation: Mask ventilation without difficulty and Nasal airway inserted- appropriate to patient size Laryngoscope Size: Glidescope Tube size: 7.5 mm Number of attempts: 1 Placement Confirmation: ETT inserted through vocal cords under direct vision, CO2 detector, Breath sounds checked- equal and bilateral and Positive ETCO2 Tube secured with: ETT holder       Lianne Cure, DO [id number]

## 2021-04-13 NOTE — ED Triage Notes (Signed)
Pt bib [facility] as post cpr. Pt found by mother who heard a collapse and found pt unresponsive on ground. Per mother pt has hx of heroin use. PEA on ems arrival, 15 mins total cpr, 1 epi, 3 defibs (200J, 300J, 360J). Pt converted to vfib. Contusion to R forehead. No airway in place on arrival. IO L tib.   BP 164/97, Spo2 100% NRB

## 2021-04-14 ENCOUNTER — Inpatient Hospital Stay (HOSPITAL_COMMUNITY): Payer: 59

## 2021-04-14 ENCOUNTER — Encounter (HOSPITAL_COMMUNITY): Payer: Self-pay | Admitting: Interventional Cardiology

## 2021-04-14 DIAGNOSIS — I5041 Acute combined systolic (congestive) and diastolic (congestive) heart failure: Secondary | ICD-10-CM | POA: Diagnosis not present

## 2021-04-14 DIAGNOSIS — N179 Acute kidney failure, unspecified: Secondary | ICD-10-CM | POA: Diagnosis not present

## 2021-04-14 DIAGNOSIS — J45909 Unspecified asthma, uncomplicated: Secondary | ICD-10-CM | POA: Diagnosis not present

## 2021-04-14 DIAGNOSIS — J9602 Acute respiratory failure with hypercapnia: Secondary | ICD-10-CM | POA: Diagnosis not present

## 2021-04-14 DIAGNOSIS — Z79899 Other long term (current) drug therapy: Secondary | ICD-10-CM | POA: Diagnosis not present

## 2021-04-14 DIAGNOSIS — J9601 Acute respiratory failure with hypoxia: Secondary | ICD-10-CM | POA: Diagnosis not present

## 2021-04-14 DIAGNOSIS — I469 Cardiac arrest, cause unspecified: Secondary | ICD-10-CM | POA: Diagnosis not present

## 2021-04-14 DIAGNOSIS — I213 ST elevation (STEMI) myocardial infarction of unspecified site: Secondary | ICD-10-CM

## 2021-04-14 DIAGNOSIS — G931 Anoxic brain damage, not elsewhere classified: Secondary | ICD-10-CM | POA: Diagnosis not present

## 2021-04-14 DIAGNOSIS — W19XXXA Unspecified fall, initial encounter: Secondary | ICD-10-CM | POA: Diagnosis present

## 2021-04-14 DIAGNOSIS — R451 Restlessness and agitation: Secondary | ICD-10-CM | POA: Diagnosis not present

## 2021-04-14 DIAGNOSIS — E872 Acidosis, unspecified: Secondary | ICD-10-CM | POA: Diagnosis not present

## 2021-04-14 DIAGNOSIS — R739 Hyperglycemia, unspecified: Secondary | ICD-10-CM | POA: Diagnosis not present

## 2021-04-14 DIAGNOSIS — S0101XA Laceration without foreign body of scalp, initial encounter: Secondary | ICD-10-CM | POA: Diagnosis not present

## 2021-04-14 DIAGNOSIS — I519 Heart disease, unspecified: Secondary | ICD-10-CM | POA: Diagnosis present

## 2021-04-14 DIAGNOSIS — F05 Delirium due to known physiological condition: Secondary | ICD-10-CM | POA: Diagnosis not present

## 2021-04-14 DIAGNOSIS — I2510 Atherosclerotic heart disease of native coronary artery without angina pectoris: Secondary | ICD-10-CM | POA: Diagnosis not present

## 2021-04-14 DIAGNOSIS — I4901 Ventricular fibrillation: Secondary | ICD-10-CM | POA: Diagnosis not present

## 2021-04-14 DIAGNOSIS — F17210 Nicotine dependence, cigarettes, uncomplicated: Secondary | ICD-10-CM | POA: Diagnosis not present

## 2021-04-14 DIAGNOSIS — I462 Cardiac arrest due to underlying cardiac condition: Secondary | ICD-10-CM | POA: Diagnosis not present

## 2021-04-14 DIAGNOSIS — R092 Respiratory arrest: Secondary | ICD-10-CM | POA: Diagnosis present

## 2021-04-14 DIAGNOSIS — Z9861 Coronary angioplasty status: Secondary | ICD-10-CM | POA: Diagnosis not present

## 2021-04-14 DIAGNOSIS — I2102 ST elevation (STEMI) myocardial infarction involving left anterior descending coronary artery: Principal | ICD-10-CM

## 2021-04-14 DIAGNOSIS — K7200 Acute and subacute hepatic failure without coma: Secondary | ICD-10-CM | POA: Diagnosis present

## 2021-04-14 DIAGNOSIS — F1110 Opioid abuse, uncomplicated: Secondary | ICD-10-CM | POA: Diagnosis present

## 2021-04-14 DIAGNOSIS — Z781 Physical restraint status: Secondary | ICD-10-CM | POA: Diagnosis not present

## 2021-04-14 DIAGNOSIS — I493 Ventricular premature depolarization: Secondary | ICD-10-CM | POA: Diagnosis not present

## 2021-04-14 DIAGNOSIS — Z20822 Contact with and (suspected) exposure to covid-19: Secondary | ICD-10-CM | POA: Diagnosis not present

## 2021-04-14 LAB — CBC
HCT: 48.6 % (ref 39.0–52.0)
Hemoglobin: 16.5 g/dL (ref 13.0–17.0)
MCH: 33 pg (ref 26.0–34.0)
MCHC: 34 g/dL (ref 30.0–36.0)
MCV: 97.2 fL (ref 80.0–100.0)
Platelets: 355 10*3/uL (ref 150–400)
RBC: 5 MIL/uL (ref 4.22–5.81)
RDW: 13.5 % (ref 11.5–15.5)
WBC: 16.2 10*3/uL — ABNORMAL HIGH (ref 4.0–10.5)
nRBC: 0 % (ref 0.0–0.2)

## 2021-04-14 LAB — LACTIC ACID, PLASMA
Lactic Acid, Venous: 8.7 mmol/L (ref 0.5–1.9)
Lactic Acid, Venous: 5.3 mmol/L (ref 0.5–1.9)
Lactic Acid, Venous: 1.2 mmol/L (ref 0.5–1.9)

## 2021-04-14 LAB — COMPREHENSIVE METABOLIC PANEL
ALT: 474 U/L — ABNORMAL HIGH (ref 0–44)
ALT: 437 U/L — ABNORMAL HIGH (ref 0–44)
AST: 353 U/L — ABNORMAL HIGH (ref 15–41)
AST: 360 U/L — ABNORMAL HIGH (ref 15–41)
Albumin: 4.2 g/dL (ref 3.5–5.0)
Albumin: 4.2 g/dL (ref 3.5–5.0)
Alkaline Phosphatase: 64 U/L (ref 38–126)
Alkaline Phosphatase: 62 U/L (ref 38–126)
Anion gap: 18 — ABNORMAL HIGH (ref 5–15)
Anion gap: 13 (ref 5–15)
BUN: 16 mg/dL (ref 6–20)
BUN: 14 mg/dL (ref 6–20)
CO2: 16 mmol/L — ABNORMAL LOW (ref 22–32)
CO2: 19 mmol/L — ABNORMAL LOW (ref 22–32)
Calcium: 9 mg/dL (ref 8.9–10.3)
Calcium: 9.2 mg/dL (ref 8.9–10.3)
Chloride: 108 mmol/L (ref 98–111)
Chloride: 110 mmol/L (ref 98–111)
Creatinine, Ser: 1.59 mg/dL — ABNORMAL HIGH (ref 0.61–1.24)
Creatinine, Ser: 1.32 mg/dL — ABNORMAL HIGH (ref 0.61–1.24)
GFR, Estimated: 56 mL/min — ABNORMAL LOW (ref >60)
GFR, Estimated: >60 mL/min (ref >60)
Glucose, Bld: 252 mg/dL — ABNORMAL HIGH (ref 70–99)
Glucose, Bld: 166 mg/dL — ABNORMAL HIGH (ref 70–99)
Potassium: 3.9 mmol/L (ref 3.5–5.1)
Potassium: 4.1 mmol/L (ref 3.5–5.1)
Sodium: 142 mmol/L (ref 135–145)
Sodium: 142 mmol/L (ref 135–145)
Total Bilirubin: 0.7 mg/dL (ref 0.3–1.2)
Total Bilirubin: 0.8 mg/dL (ref 0.3–1.2)
Total Protein: 6.2 g/dL — ABNORMAL LOW (ref 6.5–8.1)
Total Protein: 6.5 g/dL (ref 6.5–8.1)

## 2021-04-14 LAB — URINALYSIS, COMPLETE (UACMP) WITH MICROSCOPIC
Bilirubin Urine: NEGATIVE
Glucose, UA: NEGATIVE mg/dL
Ketones, ur: NEGATIVE mg/dL
Leukocytes,Ua: NEGATIVE
Nitrite: NEGATIVE
Protein, ur: 30 mg/dL — AB
RBC / HPF: >50 RBC/hpf (ref 0–5)
Specific Gravity, Urine: 1.025 (ref 1.005–1.030)
pH: 5.5 (ref 5.0–8.0)

## 2021-04-14 LAB — POCT ACTIVATED CLOTTING TIME
Activated Clotting Time: 239 seconds
Activated Clotting Time: 287 seconds
Activated Clotting Time: 588 seconds

## 2021-04-14 LAB — GLUCOSE, CAPILLARY
Glucose-Capillary: 118 mg/dL — ABNORMAL HIGH (ref 70–99)
Glucose-Capillary: 108 mg/dL — ABNORMAL HIGH (ref 70–99)
Glucose-Capillary: 106 mg/dL — ABNORMAL HIGH (ref 70–99)
Glucose-Capillary: 104 mg/dL — ABNORMAL HIGH (ref 70–99)
Glucose-Capillary: 116 mg/dL — ABNORMAL HIGH (ref 70–99)
Glucose-Capillary: 107 mg/dL — ABNORMAL HIGH (ref 70–99)

## 2021-04-14 LAB — BASIC METABOLIC PANEL
Anion gap: 8 (ref 5–15)
BUN: 14 mg/dL (ref 6–20)
CO2: 23 mmol/L (ref 22–32)
Calcium: 8.5 mg/dL — ABNORMAL LOW (ref 8.9–10.3)
Chloride: 111 mmol/L (ref 98–111)
Creatinine, Ser: 1.11 mg/dL (ref 0.61–1.24)
GFR, Estimated: >60 mL/min (ref >60)
Glucose, Bld: 113 mg/dL — ABNORMAL HIGH (ref 70–99)
Potassium: 4.3 mmol/L (ref 3.5–5.1)
Sodium: 142 mmol/L (ref 135–145)

## 2021-04-14 LAB — POCT I-STAT 7, (LYTES, BLD GAS, ICA,H+H)
Acid-base deficit: 4 mmol/L — ABNORMAL HIGH (ref 0.0–2.0)
Bicarbonate: 18.7 mmol/L — ABNORMAL LOW (ref 20.0–28.0)
Calcium, Ion: 1.19 mmol/L (ref 1.15–1.40)
HCT: 44 % (ref 39.0–52.0)
Hemoglobin: 15 g/dL (ref 13.0–17.0)
O2 Saturation: 99 %
Patient temperature: 35.8
Potassium: 4 mmol/L (ref 3.5–5.1)
Sodium: 140 mmol/L (ref 135–145)
TCO2: 19 mmol/L — ABNORMAL LOW (ref 22–32)
pCO2 arterial: 25.9 mmHg — ABNORMAL LOW (ref 32.0–48.0)
pH, Arterial: 7.462 — ABNORMAL HIGH (ref 7.350–7.450)
pO2, Arterial: 123 mmHg — ABNORMAL HIGH (ref 83.0–108.0)

## 2021-04-14 LAB — TROPONIN I (HIGH SENSITIVITY)
Troponin I (High Sensitivity): 3159 ng/L (ref <18)
Troponin I (High Sensitivity): 17090 ng/L (ref <18)
Troponin I (High Sensitivity): 16243 ng/L (ref <18)

## 2021-04-14 LAB — MRSA NEXT GEN BY PCR, NASAL: MRSA by PCR Next Gen: NOT DETECTED

## 2021-04-14 LAB — ECHOCARDIOGRAM COMPLETE
Area-P 1/2: 2.95 cm2
Height: 69 in
S' Lateral: 3.6 cm
Weight: 3287.5 oz

## 2021-04-14 LAB — MAGNESIUM: Magnesium: 2.1 mg/dL (ref 1.7–2.4)

## 2021-04-14 LAB — PROTIME-INR
INR: 1 (ref 0.8–1.2)
Prothrombin Time: 13.5 seconds (ref 11.4–15.2)

## 2021-04-14 LAB — HEMOGLOBIN A1C
Hgb A1c MFr Bld: 5.7 % — ABNORMAL HIGH (ref 4.8–5.6)
Mean Plasma Glucose: 116.89 mg/dL

## 2021-04-14 LAB — HIV ANTIBODY (ROUTINE TESTING W REFLEX): HIV Screen 4th Generation wRfx: NONREACTIVE

## 2021-04-14 MED ORDER — PANTOPRAZOLE 2 MG/ML SUSPENSION
40.0000 mg | Freq: Every day | ORAL | Status: DC
  Filled 2021-04-14: qty 20

## 2021-04-14 MED ORDER — MAGNESIUM SULFATE 2 GM/50ML IV SOLN: 2.0000 g | Freq: Once | INTRAVENOUS | Status: DC | PRN

## 2021-04-14 MED ORDER — ASPIRIN 81 MG PO CHEW
81.0000 mg | CHEWABLE_TABLET | Freq: Every day | ORAL | Status: DC
  Filled 2021-04-14: qty 1

## 2021-04-14 MED ORDER — HEPARIN SODIUM (PORCINE) 1000 UNIT/ML IJ SOLN
INTRAMUSCULAR | Status: DC | PRN
  Administered 2021-04-14: 4000 [IU] via INTRAVENOUS
  Administered 2021-04-14: 2500 [IU] via INTRAVENOUS
  Administered 2021-04-14: 3000 [IU] via INTRAVENOUS

## 2021-04-14 MED ORDER — HALOPERIDOL LACTATE 2 MG/ML PO CONC
5.0000 mg | ORAL | Status: AC
Start: 2021-04-14 — End: 2021-04-15
  Filled 2021-04-14: qty 2.5

## 2021-04-14 MED ORDER — LORAZEPAM 2 MG/ML IJ SOLN: 2.0000 mg | INTRAMUSCULAR | Status: AC

## 2021-04-14 MED ORDER — ACETAMINOPHEN 325 MG PO TABS: 650.0000 mg | ORAL_TABLET | ORAL | Status: DC | PRN

## 2021-04-14 MED ORDER — ENOXAPARIN SODIUM 40 MG/0.4ML IJ SOSY
40.0000 mg | PREFILLED_SYRINGE | INTRAMUSCULAR | Status: DC
  Administered 2021-04-14 – 2021-04-15 (×2): 40 mg via SUBCUTANEOUS
  Filled 2021-04-14 (×2): qty 0.4

## 2021-04-14 MED ORDER — MIDAZOLAM HCL 2 MG/2ML IJ SOLN
INTRAMUSCULAR | Status: AC
  Filled 2021-04-14: qty 2

## 2021-04-14 MED ORDER — ONDANSETRON HCL 4 MG/2ML IJ SOLN
4.0000 mg | Freq: Four times a day (QID) | INTRAMUSCULAR | Status: DC | PRN
  Filled 2021-04-14: qty 2

## 2021-04-14 MED ORDER — ATORVASTATIN CALCIUM 80 MG PO TABS
80.0000 mg | ORAL_TABLET | Freq: Every day | ORAL | Status: DC
  Filled 2021-04-14: qty 1

## 2021-04-14 MED ORDER — VERAPAMIL HCL 2.5 MG/ML IV SOLN
INTRAVENOUS | Status: AC
  Filled 2021-04-14: qty 2

## 2021-04-14 MED ORDER — ACETAMINOPHEN 325 MG PO TABS
650.0000 mg | ORAL_TABLET | ORAL | Status: DC
  Administered 2021-04-15: 650 mg via ORAL
  Filled 2021-04-14: qty 2

## 2021-04-14 MED ORDER — ONDANSETRON HCL 4 MG/2ML IJ SOLN: 4.0000 mg | Freq: Four times a day (QID) | INTRAMUSCULAR | Status: DC | PRN

## 2021-04-14 MED ORDER — POLYETHYLENE GLYCOL 3350 17 G PO PACK: 17.0000 g | PACK | Freq: Every day | ORAL | Status: DC | PRN

## 2021-04-14 MED ORDER — ORAL CARE MOUTH RINSE
15.0000 mL | OROMUCOSAL | Status: DC
  Administered 2021-04-14 (×6): 15 mL via OROMUCOSAL

## 2021-04-14 MED ORDER — ACETAMINOPHEN 160 MG/5ML PO SOLN
650.0000 mg | ORAL | Status: DC
  Administered 2021-04-14 (×2): 650 mg
  Filled 2021-04-14 (×2): qty 20.3

## 2021-04-14 MED ORDER — TIROFIBAN (AGGRASTAT) BOLUS VIA INFUSION
INTRAVENOUS | Status: DC | PRN
  Administered 2021-04-14: 2040 ug via INTRAVENOUS

## 2021-04-14 MED ORDER — SODIUM CHLORIDE 0.9% FLUSH: 3.0000 mL | INTRAVENOUS | Status: DC | PRN

## 2021-04-14 MED ORDER — CHLORHEXIDINE GLUCONATE CLOTH 2 % EX PADS: 6.0000 | MEDICATED_PAD | Freq: Every day | CUTANEOUS | Status: DC

## 2021-04-14 MED ORDER — ENOXAPARIN SODIUM 40 MG/0.4ML IJ SOSY: 40.0000 mg | PREFILLED_SYRINGE | INTRAMUSCULAR | Status: DC

## 2021-04-14 MED ORDER — HEPARIN (PORCINE) IN NACL 1000-0.9 UT/500ML-% IV SOLN
INTRAVENOUS | Status: DC | PRN
  Administered 2021-04-14 (×2): 500 mL

## 2021-04-14 MED ORDER — HEPARIN SODIUM (PORCINE) 1000 UNIT/ML IJ SOLN
INTRAMUSCULAR | Status: AC
  Filled 2021-04-14: qty 10

## 2021-04-14 MED ORDER — ACETAMINOPHEN 160 MG/5ML PO SOLN: 650.0000 mg | ORAL | Status: DC | PRN

## 2021-04-14 MED ORDER — LORAZEPAM 2 MG/ML IJ SOLN
2.0000 mg | Freq: Four times a day (QID) | INTRAMUSCULAR | Status: DC
  Filled 2021-04-14 (×2): qty 1

## 2021-04-14 MED ORDER — SODIUM CHLORIDE 0.9% FLUSH
3.0000 mL | Freq: Two times a day (BID) | INTRAVENOUS | Status: DC
  Administered 2021-04-14 (×2): 3 mL via INTRAVENOUS

## 2021-04-14 MED ORDER — HALOPERIDOL LACTATE 5 MG/ML IJ SOLN
2.5000 mg | Freq: Four times a day (QID) | INTRAMUSCULAR | Status: DC
  Filled 2021-04-14: qty 1

## 2021-04-14 MED ORDER — HYDRALAZINE HCL 20 MG/ML IJ SOLN: 10.0000 mg | INTRAMUSCULAR | Status: AC | PRN

## 2021-04-14 MED ORDER — ACETAMINOPHEN 650 MG RE SUPP: 650.0000 mg | RECTAL | Status: DC

## 2021-04-14 MED ORDER — DOCUSATE SODIUM 50 MG/5ML PO LIQD: 100.0000 mg | Freq: Two times a day (BID) | ORAL | Status: DC | PRN

## 2021-04-14 MED ORDER — SODIUM CHLORIDE 0.9 % IV SOLN
250.0000 mL | INTRAVENOUS | Status: DC
  Administered 2021-04-14: 250 mL via INTRAVENOUS

## 2021-04-14 MED ORDER — TICAGRELOR 90 MG PO TABS: 90.0000 mg | ORAL_TABLET | Freq: Two times a day (BID) | ORAL | Status: DC

## 2021-04-14 MED ORDER — TICAGRELOR 90 MG PO TABS
90.0000 mg | ORAL_TABLET | Freq: Two times a day (BID) | ORAL | Status: DC
  Filled 2021-04-14: qty 1

## 2021-04-14 MED ORDER — SODIUM CHLORIDE 0.9 % IV SOLN: INTRAVENOUS | Status: AC

## 2021-04-14 MED ORDER — POLYETHYLENE GLYCOL 3350 17 G PO PACK
17.0000 g | PACK | Freq: Every day | ORAL | Status: DC
  Filled 2021-04-14: qty 1

## 2021-04-14 MED ORDER — HALOPERIDOL LACTATE 5 MG/ML IJ SOLN
INTRAMUSCULAR | Status: AC
  Administered 2021-04-14: 5 mg
  Filled 2021-04-14: qty 1

## 2021-04-14 MED ORDER — TICAGRELOR 90 MG PO TABS
ORAL_TABLET | ORAL | Status: AC
  Filled 2021-04-14: qty 2

## 2021-04-14 MED ORDER — MIDAZOLAM-SODIUM CHLORIDE 100-0.9 MG/100ML-% IV SOLN
INTRAVENOUS | Status: AC | PRN
  Administered 2021-04-14: 2 mg/h via INTRAVENOUS

## 2021-04-14 MED ORDER — LORAZEPAM 2 MG/ML IJ SOLN
INTRAMUSCULAR | Status: AC
  Administered 2021-04-14: 2 mg
  Filled 2021-04-14: qty 1

## 2021-04-14 MED ORDER — CHLORHEXIDINE GLUCONATE 0.12% ORAL RINSE (MEDLINE KIT)
15.0000 mL | Freq: Two times a day (BID) | OROMUCOSAL | Status: DC
  Administered 2021-04-14 (×2): 15 mL via OROMUCOSAL

## 2021-04-14 MED ORDER — SODIUM CHLORIDE 0.9 % IV SOLN
0.7500 ug/kg/min | INTRAVENOUS | Status: DC
  Administered 2021-04-14 – 2021-04-15 (×2): 0.75 ug/kg/min via INTRAVENOUS
  Filled 2021-04-14 (×2): qty 50

## 2021-04-14 MED ORDER — BUSPIRONE HCL 10 MG PO TABS: 30.0000 mg | ORAL_TABLET | Freq: Three times a day (TID) | ORAL | Status: DC | PRN

## 2021-04-14 MED ORDER — ATORVASTATIN CALCIUM 80 MG PO TABS: 80.0000 mg | ORAL_TABLET | Freq: Every day | ORAL | Status: DC

## 2021-04-14 MED ORDER — NITROGLYCERIN 1 MG/10 ML FOR IR/CATH LAB
INTRA_ARTERIAL | Status: DC | PRN
  Administered 2021-04-14: 100 ug via INTRACORONARY

## 2021-04-14 MED ORDER — ACETAMINOPHEN 650 MG RE SUPP: 650.0000 mg | RECTAL | Status: DC | PRN

## 2021-04-14 MED ORDER — DEXMEDETOMIDINE HCL IN NACL 400 MCG/100ML IV SOLN
0.4000 ug/kg/h | INTRAVENOUS | Status: DC
  Administered 2021-04-14: 0.6 ug/kg/h via INTRAVENOUS
  Administered 2021-04-14: 0.4 ug/kg/h via INTRAVENOUS
  Administered 2021-04-14 – 2021-04-15 (×2): 0.6 ug/kg/h via INTRAVENOUS
  Filled 2021-04-14 (×4): qty 100

## 2021-04-14 MED ORDER — MIDAZOLAM HCL 2 MG/2ML IJ SOLN
INTRAMUSCULAR | Status: DC | PRN
  Administered 2021-04-14 (×3): 1 mg via INTRAVENOUS

## 2021-04-14 MED ORDER — ORAL CARE MOUTH RINSE: 15.0000 mL | Freq: Two times a day (BID) | OROMUCOSAL | Status: DC

## 2021-04-14 MED ORDER — IOHEXOL 350 MG/ML SOLN
INTRAVENOUS | Status: DC | PRN
  Administered 2021-04-14: 170 mL

## 2021-04-14 MED ORDER — DOCUSATE SODIUM 50 MG/5ML PO LIQD
100.0000 mg | Freq: Two times a day (BID) | ORAL | Status: DC
  Filled 2021-04-14: qty 10

## 2021-04-14 MED ORDER — SODIUM CHLORIDE 0.9 % IV SOLN: INTRAVENOUS | Status: DC

## 2021-04-14 MED ORDER — TIROFIBAN HCL IN NACL 5-0.9 MG/100ML-% IV SOLN
INTRAVENOUS | Status: AC | PRN
  Administered 2021-04-14: .15 ug/kg/min via INTRAVENOUS

## 2021-04-14 MED ORDER — SODIUM CHLORIDE 0.9 % IV SOLN: 250.0000 mL | INTRAVENOUS | Status: DC | PRN

## 2021-04-14 MED ORDER — TIROFIBAN HCL IN NACL 5-0.9 MG/100ML-% IV SOLN
0.1500 ug/kg/min | INTRAVENOUS | Status: AC
  Administered 2021-04-14: 0.15 ug/kg/min via INTRAVENOUS

## 2021-04-14 MED ORDER — INSULIN ASPART 100 UNIT/ML IJ SOLN
0.0000 [IU] | INTRAMUSCULAR | Status: DC
  Administered 2021-04-15: 1 [IU] via SUBCUTANEOUS

## 2021-04-14 MED ORDER — LABETALOL HCL 5 MG/ML IV SOLN: 10.0000 mg | INTRAVENOUS | Status: AC | PRN

## 2021-04-14 MED ORDER — TIROFIBAN HCL IN NACL 5-0.9 MG/100ML-% IV SOLN
INTRAVENOUS | Status: AC
  Filled 2021-04-14: qty 100

## 2021-04-14 MED ORDER — TICAGRELOR 90 MG PO TABS
ORAL_TABLET | ORAL | Status: DC | PRN
  Administered 2021-04-14: 180 mg via NASOGASTRIC

## 2021-04-14 MED ORDER — ASPIRIN 81 MG PO CHEW: 81.0000 mg | CHEWABLE_TABLET | Freq: Every day | ORAL | Status: DC

## 2021-04-14 MED ORDER — MIDAZOLAM-SODIUM CHLORIDE 100-0.9 MG/100ML-% IV SOLN
0.5000 mg/h | INTRAVENOUS | Status: DC
  Filled 2021-04-14: qty 100

## 2021-04-14 MED FILL — Lidocaine HCl Local Preservative Free (PF) Inj 1%: INTRAMUSCULAR | Qty: 30 | Status: AC

## 2021-04-14 NOTE — Progress Notes (Signed)
Progress Note  Patient Name: Joe Miranda Date of Encounter: 04/14/2021  [facility] Cardiologist: Dr. Pernell Dupre  Subjective   Postop day 0 VF arrest, resuscitated, PCI proximal LAD, intubated and sedated.  Inpatient Medications    Scheduled Meds:  acetaminophen  650 mg Oral Q4H   Or   acetaminophen (TYLENOL) oral liquid 160 mg/5 mL  650 mg Per Tube Q4H   Or   acetaminophen  650 mg Rectal Q4H   aspirin  81 mg Per Tube Daily   atorvastatin  80 mg Per Tube Daily   chlorhexidine gluconate (MEDLINE KIT)  15 mL Mouth Rinse BID   Chlorhexidine Gluconate Cloth  6 each Topical Daily   docusate  100 mg Per Tube BID   enoxaparin (LOVENOX) injection  40 mg Subcutaneous Q24H   insulin aspart  0-9 Units Subcutaneous Q4H   mouth rinse  15 mL Mouth Rinse 10 times per day   pantoprazole sodium  40 mg Per Tube Daily   polyethylene glycol  17 g Per Tube Daily   sodium chloride flush  3 mL Intravenous Q12H   ticagrelor  90 mg Per NG tube BID   Continuous Infusions:  sodium chloride     sodium chloride 250 mL (04/14/21 0148)   amiodarone 30 mg/hr (04/14/21 0900)   fentaNYL infusion INTRAVENOUS Stopped (04/14/21 0830)   magnesium sulfate     midazolam 6.5 mg/hr (04/14/21 0445)   PRN Meds: sodium chloride, [START ON 04/15/2021] acetaminophen **OR** [START ON 04/15/2021] acetaminophen (TYLENOL) oral liquid 160 mg/5 mL **OR** [START ON 04/15/2021] acetaminophen, busPIRone **OR** busPIRone, etomidate, magnesium sulfate, ondansetron (ZOFRAN) IV, polyethylene glycol, rocuronium, sodium chloride flush   Vital Signs    Vitals:   04/14/21 0830 04/14/21 0836 04/14/21 0845 04/14/21 0900  BP: 132/85  115/69 122/70  Pulse: 89 71 75 75  Resp: 15 11 20 12   Temp: 99.3 F (37.4 C) 99.3 F (37.4 C) 99.1 F (37.3 C) 99 F (37.2 C)  TempSrc:      SpO2: 100% 98% 100% 100%  Weight:      Height:        Intake/Output Summary (Last 24 hours) at 04/14/2021 0924 Last data filed at 04/14/2021  0900 Gross per 24 hour  Intake 1109.8 ml  Output 910 ml  Net 199.8 ml   Last 3 Weights 04/14/2021 04/14/2021 01/24/2016  Weight (lbs) 205 lb 7.5 oz 180 lb 195 lb  Weight (kg) 93.2 kg 81.647 kg 88.451 kg      Telemetry    No- Personally Reviewed  ECG    Normal sinus rhythm with PVCs and short runs of nonsustained ventricular tachycardia- Personally Reviewed  Physical Exam   GEN: No acute distress.   Neck: No JVD Cardiac: RRR, no murmurs, rubs, or gallops.  Respiratory: Clear to auscultation bilaterally. GI: Soft, nontender, non-distended  MS: No edema; No deformity. Neuro: Just extubated, confused Psych: Normal affect   Labs    High Sensitivity Troponin:   Recent Labs  Lab 04/14/21 0240 04/14/21 0610  TROPONINIHS 3,159* 17,090*     Chemistry Recent Labs  Lab 04/13/21 2304 04/13/21 2309 04/13/21 2321 04/13/21 2348 04/14/21 0240 04/14/21 0305  NA 142 141   < > 141 142 140  K 3.9 4.0   < > 3.5 4.1 4.0  CL 108 110  --   --  110  --   CO2 16*  --   --   --  19*  --   GLUCOSE 252*  249*  --   --  166*  --   BUN 16 18  --   --  14  --   CREATININE 1.59* 1.40*  --   --  1.32*  --   CALCIUM 9.0  --   --   --  9.2  --   MG 2.1  --   --   --   --   --   PROT 6.2*  --   --   --  6.5  --   ALBUMIN 4.2  --   --   --  4.2  --   AST 353*  --   --   --  360*  --   ALT 474*  --   --   --  437*  --   ALKPHOS 64  --   --   --  62  --   BILITOT 0.7  --   --   --  0.8  --   GFRNONAA 56*  --   --   --  >60  --   ANIONGAP 18*  --   --   --  13  --    < > = values in this interval not displayed.    Lipids No results for input(s): CHOL, TRIG, HDL, LABVLDL, LDLCALC, CHOLHDL in the last 168 hours.  Hematology Recent Labs  Lab 04/13/21 2304 04/13/21 2309 04/13/21 2348 04/14/21 0240 04/14/21 0305  WBC 12.8*  --   --  16.2*  --   RBC 4.85  --   --  5.00  --   HGB 15.5   < > 16.7 16.5 15.0  HCT 50.5   < > 49.0 48.6 44.0  MCV 104.1*  --   --  97.2  --   MCH 32.0  --   --   33.0  --   MCHC 30.7  --   --  34.0  --   RDW 13.7  --   --  13.5  --   PLT 338  --   --  355  --    < > = values in this interval not displayed.   Thyroid No results for input(s): TSH, FREET4 in the last 168 hours.  BNPNo results for input(s): BNP, PROBNP in the last 168 hours.  DDimer No results for input(s): DDIMER in the last 168 hours.   Radiology    CT HEAD WO CONTRAST (5MM)  Result Date: 04/13/2021 CLINICAL DATA:  Facial trauma, blunt. EXAM: CT HEAD WITHOUT CONTRAST TECHNIQUE: Contiguous axial images were obtained from the base of the skull through the vertex without intravenous contrast. RADIATION DOSE REDUCTION: This exam was performed according to the departmental dose-optimization program which includes automated exposure control, adjustment of the mA and/or kV according to patient size and/or use of iterative reconstruction technique. COMPARISON:  None. FINDINGS: Brain: No acute intracranial hemorrhage, midline shift or mass effect. No extra-axial fluid collection. Gray-white matter differentiation is within normal limits. No hydrocephalus. Vascular: No hyperdense vessel or unexpected calcification. Skull: Normal. Negative for fracture or focal lesion. Sinuses/Orbits: No acute finding. Other: Tubes are present in the oropharynx. IMPRESSION: No acute intracranial process. Electronically Signed   By: Brett Fairy M.D.   On: 04/13/2021 23:47   CARDIAC CATHETERIZATION  Result Date: 04/14/2021 CONCLUSIONS: Out of hospital cardiac arrest requiring 20 minutes of CPR to achieve ROSC.  Initial rhythm was pulseless electrical activity. 99% thrombotic proximal LAD with TIMI grade II flow, treated with a 3.5  x 26 drug-eluting stent postdilated to 4.0 mm throughout the stented segment.  TIMI grade III flow reestablished. Normal left main Normal circumflex Normal RCA Moderately severe LV systolic dysfunction with EF estimated 25 to 30% with anteroapical akinesis and other walls hypokinetic.  LVEDP  15 mmHg. RECOMMENDATIONS: Brilinta given as a slurry into the patient's NG tube. A bolus followed by an infusion of Aggrastat was started and will run for 4 hours. Critical care medicine will comanage patient. Guarded prognosis discussed with the patient and family relative to neurological recovery and survival. Statin per injury Subcu Lovenox for DVT prophylaxis  DG Chest Port 1 View  Result Date: 04/13/2021 CLINICAL DATA:  Post CPR. EXAM: PORTABLE CHEST 1 VIEW COMPARISON:  Chest x-ray 03/02/2013. FINDINGS: Endotracheal tube tip is 2.5 cm above the carina. Enteric tube tip is in the mid stomach. Lines and tubes overlie the chest. The cardiomediastinal silhouette is within normal limits for projection. There is airspace disease in the medial right upper lung. Costophrenic angles are clear. No pneumothorax. No acute fractures are identified. IMPRESSION: 1. Medial right upper lobe airspace disease. 2. Lines and tubes as above. Electronically Signed   By: Ronney Asters M.D.   On: 04/13/2021 23:42    Cardiac Studies   Cardiac catheterization/PCI drug-eluting stent (04/14/2021)  Conclusion  CONCLUSIONS: Out of hospital cardiac arrest requiring 20 minutes of CPR to achieve ROSC.  Initial rhythm was pulseless electrical activity. 99% thrombotic proximal LAD with TIMI grade II flow, treated with a 3.5 x 26 drug-eluting stent postdilated to 4.0 mm throughout the stented segment.  TIMI grade III flow reestablished. Normal left main Normal circumflex Normal RCA Moderately severe LV systolic dysfunction with EF estimated 25 to 30% with anteroapical akinesis and other walls hypokinetic.  LVEDP 15 mmHg.   RECOMMENDATIONS:   Brilinta given as a slurry into the patient's NG tube. A bolus followed by an infusion of Aggrastat was started and will run for 4 hours. Critical care medicine will comanage patient. Guarded prognosis discussed with the patient and family relative to neurological recovery and  survival. Statin per injury Subcu Lovenox for DVT prophylaxis  Coronary Diagrams  Diagnostic Dominance: Right Intervention  Noticed any other risk factors Patient Profile     41 y.o. male African-American male who apparently was seen in the emergency room last month with chest pain and left prior to being evaluated.  He is been having off-and-on chest pain since.  He had witnessed arrest last night and had bystander CPR by his girlfriend and ultimately by first responders with ROSC of approximately 15 minutes.  He was brought to the emergency room where his EKG was consistent with "anterior STEMI" and was brought emergently to the Cath Lab.  Assessment & Plan    1: Anterior STEMI-witnessed cardiac arrest/sudden cardiac death with ROSC in 15 minutes.  Urgent cath revealed a subtotally occluded proximal LAD with ruptured plaque and TIMI I-II flow.  The remainder of his coronary artery was free of significant disease.  He had anteroapical severe hypokinesia with estimated EF visually of approximately 30%.  He had DES placed successfully with restoration of TIMI-3 flow.  He is on dual antiplatelet therapy including aspirin Brilinta which he received via "OG tube".  The patient self extubated this morning and appears to be alert and oriented and responsive, following commands.  He does have frequent PVCs on telemetry.  We will initiate low-dose beta-blockade and high-dose statin therapy.       For questions or  updates, please contact [place] [facility] Please consult [email or web address] for contact info under        Signed, Quay Burow, MD  04/14/2021, 9:24 AM

## 2021-04-14 NOTE — Progress Notes (Signed)
°  Echocardiogram 2D Echocardiogram has been performed.  Delcie Roch 04/14/2021, 9:32 AM

## 2021-04-14 NOTE — Progress Notes (Signed)
ANTICOAGULATION CONSULT NOTE - Follow Up Consult  Pharmacy Consult for CANGRELOR Indication: Patient is post PCI with DES placed in LAD and inability to take PO or PT meds  No Known Allergies  Patient Measurements: Height: 5\' 9"  (175.3 cm) Weight: 93.2 kg (205 lb 7.5 oz) IBW/kg (Calculated) : 70.7  Vital Signs: Temp: 98.6 F (37 C) (01/18 1000) Temp Source: Bladder (01/18 0800) BP: 91/57 (01/18 1230) Pulse Rate: 65 (01/18 1230)  Labs: Recent Labs    04/13/21 2304 04/13/21 2309 04/13/21 2321 04/13/21 2348 04/14/21 0240 04/14/21 0305 04/14/21 0610 04/14/21 0810  HGB 15.5 17.0   < > 16.7 16.5 15.0  --   --   HCT 50.5 50.0   < > 49.0 48.6 44.0  --   --   PLT 338  --   --   --  355  --   --   --   LABPROT 13.5  --   --   --   --   --   --   --   INR 1.0  --   --   --   --   --   --   --   CREATININE 1.59* 1.40*  --   --  1.32*  --   --   --   TROPONINIHS  --   --   --   --  3,159*  --  17,090* 16,243*   < > = values in this interval not displayed.    Estimated Creatinine Clearance: 83 mL/min (A) (by C-G formula based on SCr of 1.32 mg/dL (H)).  Assessment: Patient had severe agitation 1/18 leading to self extubation and multiple people needing to help handle while sedating him. He was loaded with ticagrelor around 0000 on 1/18. Due to excessive agitation requiring scheduled haldol, ativan, and precedex, NG tube placement is not likely. Recheck for ability to take oral meds to transition back to ticagrelor.  Plan:  Start IV cangrelor 0.75 mg/kg/hr using adjusted body weight (~79kg). Continue to monitor for changes in ability to take oral medication to transition back to ticagrelor.   2/18, PharmD PGY1 Pharmacy Resident  Please check AMION for all [facility] pharmacy phone numbers After 10:00 PM call main pharmacy [phone]

## 2021-04-14 NOTE — Progress Notes (Signed)
°  Transition of Care (TOC) Screening Note   Patient Details  Name: Joe Miranda Date of Birth: 18-Dec-1980   Transition of Care [facility]) CM/SW Contact:    Milas Gain, Rodessa Phone Number: 04/14/2021, 5:27 PM    Transition of Care Department [facility]) has reviewed patient and no TOC needs have been identified at this time. We will continue to monitor patient advancement through interdisciplinary progression rounds. If new patient transition needs arise, please place a TOC consult.

## 2021-04-14 NOTE — Progress Notes (Signed)
While performing oral care patient became very agitated and aggressive, opening eyes sustaining eye contact, attempting to sit up in bed and pulling against all four soft restraints, bucking ventilator. Dr. Denese Killings called to room and came and assessed patient. MD gave order to stop sedation and allow patient to wean on vent. Patient now resting calmly at this time.

## 2021-04-14 NOTE — Progress Notes (Signed)
IV ativan and haldol given and patient now more relaxed back in bed from ambulating to bathroom with assistance from security, RN and PA. Patient did not void nor did he have BM but was allowed opportunity to sit on commode and attempt to stand and void. Started IV precedex per Dr. Lynetta Mare who is at bedside speaking with patient's mother. Patient in NSR, on 2L nasal cannula with no respiratory distress noted.

## 2021-04-14 NOTE — CV Procedure (Signed)
Thrombotic 99% proximal LAD with TIMI II flow 3.5 x 26 Onyx --> Post dilated to 4.0 mm with TIMI 3 flow RCA, Cfx, and LM are normal. Plan: Hypothermia; Aggrastat x 2 hours; Vent support; sedation Guarded prognosis in 40 YO with ~ 20 minutes downtime/CPR and lactate 8.7

## 2021-04-14 NOTE — Progress Notes (Signed)
eLink Physician-Brief Progress Note Patient Name: Joe Miranda DOB: Mar 05, 1981 MRN: [id number]   Date of Service  04/14/2021  HPI/Events of Note  Patient needs bilateral wrist restraints to prevent self-extubation  eICU Interventions  Restraints ordered.        Joe Miranda 04/14/2021, 6:20 AM

## 2021-04-14 NOTE — Progress Notes (Signed)
Patient very agitated kicking. Dr. Denese Killings present and gave order for bilateral soft ankle restraints.

## 2021-04-14 NOTE — Progress Notes (Signed)
Pt transported to 2H 14 without event

## 2021-04-14 NOTE — Progress Notes (Signed)
Pt transported to cath lab without event. °

## 2021-04-14 NOTE — Progress Notes (Signed)
eLink Physician-Brief Progress Note Patient Name: Joe Miranda DOB: 1980-06-03 MRN: [id number]   Date of Service  04/14/2021  HPI/Events of Note  Patient brought in by EMS following out of hospital cardiac arrest secondary to an LAD occlusion with STEMI, he had approximately 27 minutes of CPR altogether, he had successful PCI with stenting of the LAD, he is intubated, sedated and mechanically ventilated, STEMI protocol is ongoing.  eICU Interventions  New Patient Evaluation.        Thomasene Lot [name] 04/14/2021, 2:15 AM

## 2021-04-14 NOTE — Progress Notes (Signed)
Self extubated at 0931. Mother at bedside and patient was very agitated. RN was out of room to find Dr. Denese Killings to come see patient. Baird Lyons, RT at bedside, patient placed on 4 L nasal cannula, even non-labored respirations. Alert following commands, will not answer questions when asked by RN. Dr. Denese Killings speaking with patient's mother at this time and Dr. Allyson Sabal at bedside.

## 2021-04-14 NOTE — H&P (Signed)
NAME:  Joe Miranda, MRN:  [id number], DOB:  1980/05/22, LOS: 0 ADMISSION DATE:  04/13/2021, CONSULTATION DATE:  04/14/21 REFERRING MD:  Randal Buba, CHIEF COMPLAINT:  cardiac arrest   History of Present Illness:  Joe Miranda is a 41 y.o. M with PMH of reported heroin abuse who was found unresponsive by his mother and in PEA arrest on EMS arrival.  Approximately 5-10 minutes before EMS arrival, received 15 minutes CPR, 1 epi and 3 defibrillations and converted to vfib. He was intubated in the ED, per report eyes were open but no purposeful movements.   EKG consistent with STEMI and pt to be taken to the cath lab. CT head negative. PCCM consulted for admission.   He was recently in the ED c/o chest pain, but left from the waiting room before he was evaluated.   Pertinent  Medical History   Past Medical History:  Diagnosis Date   Asthma    Heroin abuse ([place])     Significant Hospital Events: Including procedures, antibiotic start and stop dates in addition to other pertinent events   1/18 brought in by EMS post-arrest, intubated and taken to the cath lab 1/18 99% thrombotic proximal LAD with TIMI grade II flow, treated with a 3.5 x 26 drug-eluting stent postdilated to 4.0 mm throughout the stented segment.  TIMI grade III flow reestablished. 1/18 Moderately severe LV systolic dysfunction with EF estimated 25 to 30% with anteroapical akinesis and other walls hypokinetic.  LVEDP 15 mmHg.  Interim History / Subjective:   Becomes acutely agitated and sits up despite sedation but not following commands.   Objective   Blood pressure 104/71, pulse 77, temperature 99.3 F (37.4 C), resp. rate (!) 22, height 5\' 9"  (1.753 m), weight 93.2 kg, SpO2 100 %.    Vent Mode: PRVC FiO2 (%):  [30 %-100 %] 30 % Set Rate:  [22 bmp-28 bmp] 22 bmp Vt Set:  [560 mL] 560 mL PEEP:  [5 cmH20] 5 cmH20 Plateau Pressure:  [16 cmH20-18 cmH20] 16 cmH20   Intake/Output Summary (Last 24 hours) at 04/14/2021  0834 Last data filed at 04/14/2021 0800 Gross per 24 hour  Intake 916.15 ml  Output 910 ml  Net 6.15 ml   Filed Weights   04/14/21 0000 04/14/21 0500  Weight: 81.6 kg 93.2 kg    General:  well-nourished M, intubated and sedated HEENT: MM pink/moist, sclera anicteric, pupils equal  Neuro: examined on versed and fentanyl, will sit up eyes will track and he will move all limbs with normal strength CV: s1s2 rrr, no m/r/g PULM:  mechanical breath sounds bilaterally without significant rhonchi or wheezing, on full vent support, tolerating PSV GI: soft, non-distended GU: catheter in place with turbid urine Extremities: warm/dry, no edema  Skin: no rashes or lesions  Ancillary tests personally reviewed:   Mildly elevated creatinine at 1.32 (GFR >  60)  Mild transaminitis  Troponin 17,090  Assessment & Plan:  Active Problems:   Cardiac arrest with pulseless electrical activity (HCC)   Respiratory arrest ([place])   AKI (acute kidney injury) ([place])   Shock liver   Acute ST elevation myocardial infarction (STEMI) involving left anterior descending (LAD) coronary artery without development of Q waves (HCC)   CAD S/P percutaneous coronary angioplasty   Severe left ventricular systolic dysfunction   Delirium due to another medical condition, acute, hyperactive  Other problems Asthma by history  Heroin abuse history  Plan:  - stop sedation and proceed to extubation.  - may have  some degree of hypoxic ischemic encephalopathy - initiate secondary prevention - start betablocker, ASA, Brillinta and statin. Will likely need RAAS inhibition as well but will wait for renal function to stabilize following arrest and stenting.  - Awaiting formal echo  - urinalysis   Best Practice (right click and "Reselect all SmartList Selections" daily)   Diet/type: NPO swallow evaluation  DVT prophylaxis: other GI prophylaxis: PPI Lines: N/A Foley:  Yes, and it is still needed Code Status:  full  code Last date of multidisciplinary goals of care discussion [family updated by cardiology]   CRITICAL CARE Performed by: Kipp Brood   Total critical care time: 40 minutes  Critical care time was exclusive of separately billable procedures and treating other patients.  Critical care was necessary to treat or prevent imminent or life-threatening deterioration.  Critical care was time spent personally by me on the following activities: development of treatment plan with patient and/or surrogate as well as nursing, discussions with consultants, evaluation of patient's response to treatment, examination of patient, obtaining history from patient or surrogate, ordering and performing treatments and interventions, ordering and review of laboratory studies, ordering and review of radiographic studies, pulse oximetry and re-evaluation of patient's condition.  Kipp Brood, MD [facility] ICU Physician [place]  Pager: [phone] Or Epic Secure Chat After hours: [phone].  04/14/2021, 9:08 AM

## 2021-04-14 NOTE — Progress Notes (Signed)
Sent Dr. Denese Killings a secure chat and let MD know that patient has only had 120 cc urine output this shift and that bladder was just scanned and 90 cc result. MD gave order for NS @ 100/H.

## 2021-04-14 NOTE — H&P (Signed)
Interventional H and P  Out of hospital cardiac arrest and 41 year old with recurring episodes of chest pain since November when he signed out of the emergency room without being seen by physician but with enzymes revealing an elevated troponin.  Multiple recurring episodes of chest pain since that time. Collapsed at home and was found minutes later by his girlfriend who called EMS and attempted to perform CPR until they arrived.  She feels he may have taken 5 to 7 minutes for EMS to arrive. Upon arrival the patient did not have a pulse.  Initial rhythm check demonstrated cardiac rhythm but no pulse (PEA). 15 minutes of CPR was performed before ROSC.  The patient was shocked twice. In the emergency room the patient was noted to have a laceration above his right ear.  Head scan was done not revealing any evidence of intracranial bleed or fracture. Lactate 8.7; initial pH 7.17; creatinine 1.59; an EKG with ST elevation V2 through V4. Long discussion with the patient's family and discussion concerning possible long-term neurological injury.  Also discussed the underlying acute infarction and ultimately decided to proceed with cardiac catheterization and stenting of the LAD.  The risk of potential intracranial hemorrhage was discussed with the family despite the absence of blood on the acute CT scan that was performed. Exam reveals a patient who is comatose, being ventilated, has no murmur by auscultation, clear lung fields anteriorly, 2+ to 3+ bilateral radial pulses, no peripheral edema, and no spontaneous limb movement although he does have spontaneous respirations. Plan is emergency catheterization with PCI if indicated.  Risk of death, injury to his kidneys, intracranial bleeding and other types of bleeding, among other complications were discussed and accepted by the patient's mother and girlfriend.  Critical care time 45 minutes

## 2021-04-14 NOTE — Progress Notes (Signed)
Patient combative and extremely agitated stating that he needs to pee. RN instructed patient to relax and that she would take foley catheter out so that he could try to pee. Patient continued to become agressive towards staff. Gareth Morgan, Charity fundraiser. Security called. Dr. Denese Killings gave verbal order over phone to Joni Reining, RN charge nurse to give 5 mg IV haldol and 2mg  IV ativan. Patient not redirectable and for the most part only communicating with his mother who is at bedside.

## 2021-04-14 NOTE — ED Notes (Signed)
Pt transported to cath lab with RN, RT, tech and cardiology

## 2021-04-14 NOTE — Consult Note (Addendum)
The patient has been seen in conjunction with [name], MD. All aspects of care have been considered and discussed. The patient has been personally interviewed, examined, and all clinical data has been reviewed.  Please see separate note entered later. Likely had cardiac arrest related to ischemic heart disease and anterior infarction. Downtime is a concern relative to neurological recovery. Significant time spent in the ER evaluating data, speaking with family, and ultimately making decision to perform emergency cath with intention to perform mechanical revascularization of the LAD if occluded.  Critical Care time: 45 minutes   Cardiology Consult    Patient ID: Joe Miranda MRN: [id number], DOB/AGE: 16-Oct-1980   Admit date: 04/13/2021 Date of Consult: 04/14/2021 Requesting Provider: Veatrice Kells, MD  PCP:  Patient, No Pcp Per (Inactive) Cardiologist: none  Patient Profile    Joe Miranda is a 41 y.o. male with a history of reported asthma and heroin abuse who is s/p unwitnessed PEA arrest at home this evening with post-arrest ECG prompting CODE STEMI activation.   History of Present Illness    History obtained from family, EMS personnel, and chart review.   He was recently seen in the ED here on 02/28/21 for acute onset recurrent chest pain episodes, but left after triage evaluation. ECG at the time showed inferior ST-T wave changes and troponin came back elevated at 99. Since that time, family states he has continued to have frequent chest pain but has not sought further medical attention.   This evening, he was found by his mother unresponsive with agonal breathing on the floor. EMS was called and 5 minutes of bystander CPR performed prior to their arrival. Initially found pulseless with initial rhythm PEA and agonal breathing. Treated with epi for persistent PEA until 3rd pulse check showed VF for which he was successfully defibrillated, but subsequently re-arrested with  VF/PMVT. Ultimately ROSC achieved again with estimated total of 15 min CPR by EMS. Post-arrest ECG reviewed and is consistent with anterior STEMI. No other meds given than epi prior to arrival and he has remained hemodynamically and electrically stable since sustained ROSC achieved in the field. On arrival, GCS was 8, not following commands, gasping respiration. He was intubated with roc/etomidate and given amiodarone 376m. ECG showed persistent anterior ST elevation with reciprocal changes. Initial venous VBG 7.17/46, lactate 8.7, Cr 1.4, normal K. He was noted to have a superficial right occipital laceration with hemostasis - CT head was negative for intracranial pathology.   He was given full dose aspirin, Patient discussed with Dr. STamala Julian(IC) and decision was made to proceed with emergent coronary angiography which showed a long subtotal thrombotic occlusion of the prox-mid LAD successfully treated with 3.5 x 2101mSynergy DES post-dilated to 4 mm. He was transferred to the CICU in stable but critical condition. Family updated.   Past Medical History   Past Medical History:  Diagnosis Date   Asthma     Past Surgical History:  Procedure Laterality Date   HAND SURGERY       No Known Allergies Inpatient Medications      Family History    History reviewed. No pertinent family history. has no family status information on file.    Social History    Social History   Socioeconomic History   Marital status: Single    Spouse name: Not on file   Number of children: Not on file   Years of education: Not on file   Highest education level: Not on file  Occupational History   Not on file  Tobacco Use   Smoking status: Every Day    Packs/day: 0.50    Types: Cigarettes   Smokeless tobacco: Former  Substance and Sexual Activity   Alcohol use: Yes   Drug use: Yes    Types: Marijuana   Sexual activity: Not on file  Other Topics Concern   Not on file  Social History Narrative   Not  on file   Social Determinants of Health   Financial Resource Strain: Not on file  Food Insecurity: Not on file  Transportation Needs: Not on file  Physical Activity: Not on file  Stress: Not on file  Social Connections: Not on file  Intimate Partner Violence: Not on file     Review of Systems    Unable to obtain due to patient condition.  Physical Exam    Blood pressure (!) 164/114, pulse (!) 104, temperature (!) 96.7 F (35.9 C), temperature source Temporal, resp. rate 16, height _0  (1.753 m), weight 81.6 kg, SpO2 100 %.  Vent Mode: PRVC FiO2 (%):  [100 %] 100 % Set Rate:  [28 bmp] 28 bmp Vt Set:  [560 mL] 560 mL PEEP:  [5 cmH20] 5 cmH20 Plateau Pressure:  [18 cmH20] 18 cmH20 No intake or output data in the 24 hours ending 04/14/21 0048 Wt Readings from Last 3 Encounters:  04/14/21 81.6 kg  01/24/16 88.5 kg    CONSTITUTIONAL: Well-built male with multiple tattoos, appears stated age. On arrival, facemask and nasal trumpet in place with agonal breathing, otherwise no spontaneous movements. Diaphoretic HEENT: superficial left occipital laceration (approximately 10cm) with hemostasis. No associated crepitus or deformities.  NECK: no JVD, symmetric, no masses CARDIAC: Regular rhythm. No murmur, gallop, or friction rub. VASCULAR: Radial pulses intact bilaterally. PULMONARY/CHEST WALL: Mild ronchi bilaterally, symmetric chest rise ABDOMINAL: soft, non-tender, non-distended EXTREMITIES: no edema, no muscle atrophy, warm and well-perfused SKIN: Diaphoretic. No apparent rashes. No peripheral cyanosis. NEUROLOGIC: Pupils 74m, equal, and sluggish. GCS 4/1/4 (8).    Labs   No results for input(s): CKTOTAL, CKMB, TROPONINIHS, RELINDX in the last 72 hours.  Lab Results  Component Value Date   WBC 12.8 (H) 04/13/2021   HGB 16.7 04/13/2021   HCT 49.0 04/13/2021   MCV 104.1 (H) 04/13/2021   PLT 338 04/13/2021    Recent Labs  Lab 04/13/21 2304 04/13/21 2309  04/13/21 2321 04/13/21 2348  NA 142 141   < > 141  K 3.9 4.0   < > 3.5  CL 108 110  --   --   CO2 16*  --   --   --   BUN 16 18  --   --   CREATININE 1.59* 1.40*  --   --   CALCIUM 9.0  --   --   --   PROT 6.2*  --   --   --   BILITOT 0.7  --   --   --   ALKPHOS 64  --   --   --   ALT 474*  --   --   --   AST 353*  --   --   --   GLUCOSE 252* 249*  --   --    < > = values in this interval not displayed.     Radiology Studies    CT HEAD WO CONTRAST (5MM)  Result Date: 04/13/2021 CLINICAL DATA:  Facial trauma, blunt. EXAM: CT HEAD WITHOUT CONTRAST TECHNIQUE: Contiguous axial images  were obtained from the base of the skull through the vertex without intravenous contrast. RADIATION DOSE REDUCTION: This exam was performed according to the departmental dose-optimization program which includes automated exposure control, adjustment of the mA and/or kV according to patient size and/or use of iterative reconstruction technique. COMPARISON:  None. FINDINGS: Brain: No acute intracranial hemorrhage, midline shift or mass effect. No extra-axial fluid collection. Gray-white matter differentiation is within normal limits. No hydrocephalus. Vascular: No hyperdense vessel or unexpected calcification. Skull: Normal. Negative for fracture or focal lesion. Sinuses/Orbits: No acute finding. Other: Tubes are present in the oropharynx. IMPRESSION: No acute intracranial process. Electronically Signed   By: Brett Fairy M.D.   On: 04/13/2021 23:47   DG Chest Port 1 View  Result Date: 04/13/2021 CLINICAL DATA:  Post CPR. EXAM: PORTABLE CHEST 1 VIEW COMPARISON:  Chest x-ray 03/02/2013. FINDINGS: Endotracheal tube tip is 2.5 cm above the carina. Enteric tube tip is in the mid stomach. Lines and tubes overlie the chest. The cardiomediastinal silhouette is within normal limits for projection. There is airspace disease in the medial right upper lung. Costophrenic angles are clear. No pneumothorax. No acute fractures  are identified. IMPRESSION: 1. Medial right upper lobe airspace disease. 2. Lines and tubes as above. Electronically Signed   By: Ronney Asters M.D.   On: 04/13/2021 23:42    ECG & Cardiac Imaging    ECG shows sinus tachycardia with anterior ST elevation and reciprocal changes consistent with anterior STEMI - personally reviewed.  Coronary angiography/PCI: Findings: Long thrombotic 99% occlusion of proximal-mid LAD. Otherwise no significant disease. LVED 21 mmHg. Moderate LV systolic dysfunction with EF of 35-45% by visual estimation and severe hypokinesis to akinesis of LAD territory.  Intervention: The patient received a total of 10,500 units of heparin, bolus followed by an infusion of Aggrastat because of heavy thrombus burden within the proximal LAD lesion.  A 0.014 Prowater wire was advanced into the distal LAD.  Predilatation with a 15 x 2.5 mm balloon was then performed.  Next, a 26 x 3.5 mm Onyx stent was positioned and deployed.  Postdilatation throughout the stented segment with a 4.0 x 12 mm long balloon was performed of 14 atm x 3. Ending ACT 285 seconds with Aggrastat still running.  2500 units of heparin was administered in response to the 285 ACT. Post-intervention TIMI flow is 3 with 0% residual stenosis.   Assessment & Plan    Anterior STEMI Unwitnessed PEA arrest, OOH with bystander CPR Acute LV systolic/diastolic heart failure due to ACS, EF 35-40% Severe lactic acidosis Hypercapnic respiratory failure post-arrest Anoxic encephalopathy Acute kidney injury, likely ischemic Hepatocellular injury, likely ischemic  Hyperglycemia History of heroin abuse Recent untreated NSTEMI due to ED elopement  - Aspirin 52m daily and ticagrelor 972mBID - Daily atorvastatin 8041mtarting tonight - Can hold off on further AADs at this time unless any recurrent arrhythmia - Can start low-dose metoprolol in the morning if he remains stable - Plan to introduce ACEi/ARB and MRA once renal  function stabilizes. - TTM indicated, target temperature per CCM - Complete TTE in the morning - Check lipid profile and hgb A1c - Trend troponin every 6 hours to peak - Otherwise, management of MOD and supportive care per CCM   Signed, ScoMarykay LexD 04/14/2021, 12:48 AM  For questions or updates, please contact   Please consult [email or web address] for contact info under Cardiology/STEMI.

## 2021-04-14 NOTE — Progress Notes (Signed)
[facility] Progress Note Patient Name: Joe Miranda DOB: August 30, 1980 MRN: [id number]   Date of Service  04/14/2021  HPI/Events of Note  Patient with wide complex tachycardia with spontaneous termination, he is on Amiodarone gtt already, electrolytes are within acceptable range.  eICU Interventions  Continue Amiodarone gtt and monitor patient closely.        Kerry Kass Yamileth Hayse 04/14/2021, 4:11 AM

## 2021-04-14 NOTE — Procedures (Signed)
Extubation Procedure Note  Patient Details:   Name: Joe Miranda DOB: 06/29/1980 MRN: [id number]   Airway Documentation:    Vent end date: 04/14/21 Vent end time: 0932   Evaluation  O2 sats: stable throughout Complications: No apparent complications Patient did tolerate procedure well. Bilateral Breath Sounds: Clear, Diminished   Pt self extubated. Pt was placed on 4L [place] and is tolerating well at this time. RN and MD at bedside.  Guss Bunde 04/14/2021, 9:32 AM

## 2021-04-14 NOTE — Progress Notes (Signed)
While rounding in ED Chaplain found Mr. Joe Miranda family in consult room where they were awaiting word of the pt's condition.  Chaplain and physician met with family prior to taking pt to Cath lab.  Chaplain escorted family to Cath waiting area and sat a while offering ministry of presence and support.    [place]

## 2021-04-14 NOTE — Progress Notes (Signed)
Much calmer during remainder of shift. Arouses to voice. Alert to self but forgets that he is in the hospital and why. Reoriented by RN and or girlfriend who is at bedside. Follows commands. Precedex infusing.

## 2021-04-14 NOTE — Progress Notes (Signed)
eLink Physician-Brief Progress Note Patient Name: Joe Miranda DOB: 1980/05/01 MRN: [id number]   Date of Service  04/14/2021  HPI/Events of Note  Patient with STEMI cardiac arrest s/p  ROSC, followed by cardiac cath with PCI + stent to the LAD, he had a burst of wide complex tachycardia with spontaneous termination, electrolytes appear within normal.  eICU Interventions  Continue STEMI protocol treatment, monitor patient closely, replete electrolytes as indicated.        Thomasene Lot Clotilde Loth 04/14/2021, 4:01 AM

## 2021-04-15 ENCOUNTER — Encounter (HOSPITAL_COMMUNITY): Payer: Self-pay | Admitting: Student

## 2021-04-15 ENCOUNTER — Other Ambulatory Visit (HOSPITAL_COMMUNITY): Payer: Self-pay

## 2021-04-15 DIAGNOSIS — I519 Heart disease, unspecified: Secondary | ICD-10-CM

## 2021-04-15 LAB — COMPREHENSIVE METABOLIC PANEL
ALT: 219 U/L — ABNORMAL HIGH (ref 0–44)
AST: 149 U/L — ABNORMAL HIGH (ref 15–41)
Albumin: 3 g/dL — ABNORMAL LOW (ref 3.5–5.0)
Alkaline Phosphatase: 46 U/L (ref 38–126)
Anion gap: 7 (ref 5–15)
BUN: 12 mg/dL (ref 6–20)
CO2: 22 mmol/L (ref 22–32)
Calcium: 8.5 mg/dL — ABNORMAL LOW (ref 8.9–10.3)
Chloride: 113 mmol/L — ABNORMAL HIGH (ref 98–111)
Creatinine, Ser: 1.08 mg/dL (ref 0.61–1.24)
GFR, Estimated: >60 mL/min (ref >60)
Glucose, Bld: 117 mg/dL — ABNORMAL HIGH (ref 70–99)
Potassium: 4.3 mmol/L (ref 3.5–5.1)
Sodium: 142 mmol/L (ref 135–145)
Total Bilirubin: 0.8 mg/dL (ref 0.3–1.2)
Total Protein: 5 g/dL — ABNORMAL LOW (ref 6.5–8.1)

## 2021-04-15 LAB — CBC
HCT: 38.5 % — ABNORMAL LOW (ref 39.0–52.0)
Hemoglobin: 13.1 g/dL (ref 13.0–17.0)
MCH: 33.1 pg (ref 26.0–34.0)
MCHC: 34 g/dL (ref 30.0–36.0)
MCV: 97.2 fL (ref 80.0–100.0)
Platelets: 288 10*3/uL (ref 150–400)
RBC: 3.96 MIL/uL — ABNORMAL LOW (ref 4.22–5.81)
RDW: 13.7 % (ref 11.5–15.5)
WBC: 11.8 10*3/uL — ABNORMAL HIGH (ref 4.0–10.5)
nRBC: 0 % (ref 0.0–0.2)

## 2021-04-15 LAB — GLUCOSE, CAPILLARY
Glucose-Capillary: 123 mg/dL — ABNORMAL HIGH (ref 70–99)
Glucose-Capillary: 97 mg/dL (ref 70–99)
Glucose-Capillary: 114 mg/dL — ABNORMAL HIGH (ref 70–99)

## 2021-04-15 MED ORDER — TICAGRELOR 90 MG PO TABS
180.0000 mg | ORAL_TABLET | Freq: Once | ORAL | Status: AC
  Administered 2021-04-15: 180 mg via ORAL
  Filled 2021-04-15: qty 2

## 2021-04-15 MED ORDER — TICAGRELOR 90 MG PO TABS: 90.0000 mg | ORAL_TABLET | Freq: Two times a day (BID) | ORAL | Status: DC

## 2021-04-15 MED ORDER — LORAZEPAM 2 MG/ML IJ SOLN
2.0000 mg | Freq: Four times a day (QID) | INTRAMUSCULAR | Status: DC | PRN
  Filled 2021-04-15 (×2): qty 1

## 2021-04-15 MED ORDER — DEXMEDETOMIDINE HCL IN NACL 400 MCG/100ML IV SOLN: 0.4000 ug/kg/h | INTRAVENOUS | Status: DC

## 2021-04-15 MED ORDER — METOPROLOL SUCCINATE ER 25 MG PO TB24
25.0000 mg | ORAL_TABLET | Freq: Every day | ORAL | Status: DC
  Filled 2021-04-15 (×2): qty 1

## 2021-04-15 MED ORDER — HALOPERIDOL LACTATE 5 MG/ML IJ SOLN: 2.5000 mg | Freq: Four times a day (QID) | INTRAMUSCULAR | Status: DC | PRN

## 2021-04-15 MED ORDER — POLYETHYLENE GLYCOL 3350 17 G PO PACK: 17.0000 g | PACK | Freq: Every day | ORAL | Status: DC | PRN

## 2021-04-15 MED ORDER — DOCUSATE SODIUM 50 MG/5ML PO LIQD: 100.0000 mg | Freq: Two times a day (BID) | ORAL | Status: DC

## 2021-04-15 MED ORDER — PANTOPRAZOLE 2 MG/ML SUSPENSION: 40.0000 mg | Freq: Every day | ORAL | Status: DC

## 2021-04-15 MED ORDER — ASPIRIN 81 MG PO CHEW
81.0000 mg | CHEWABLE_TABLET | Freq: Every day | ORAL | Status: DC
  Administered 2021-04-15: 81 mg via ORAL
  Filled 2021-04-15: qty 1

## 2021-04-15 MED ORDER — ACETAMINOPHEN 325 MG PO TABS
650.0000 mg | ORAL_TABLET | Freq: Four times a day (QID) | ORAL | Status: DC | PRN
  Filled 2021-04-15: qty 2

## 2021-04-15 MED ORDER — ATORVASTATIN CALCIUM 80 MG PO TABS
80.0000 mg | ORAL_TABLET | Freq: Every day | ORAL | Status: DC
  Administered 2021-04-15: 80 mg via ORAL
  Filled 2021-04-15: qty 1

## 2021-04-15 MED ORDER — POLYETHYLENE GLYCOL 3350 17 G PO PACK: 17.0000 g | PACK | Freq: Every day | ORAL | Status: DC

## 2021-04-15 MED ORDER — LOSARTAN POTASSIUM 25 MG PO TABS: 25.0000 mg | ORAL_TABLET | Freq: Every day | ORAL | Status: DC

## 2021-04-15 MED ORDER — TRAMADOL HCL 50 MG PO TABS
50.0000 mg | ORAL_TABLET | Freq: Four times a day (QID) | ORAL | Status: DC | PRN
  Administered 2021-04-15: 50 mg via ORAL
  Filled 2021-04-15: qty 1

## 2021-04-15 MED ORDER — OLANZAPINE 10 MG IM SOLR
10.0000 mg | Freq: Once | INTRAMUSCULAR | Status: DC
  Filled 2021-04-15: qty 10

## 2021-04-15 MED ORDER — LORAZEPAM 1 MG PO TABS
2.0000 mg | ORAL_TABLET | Freq: Four times a day (QID) | ORAL | Status: DC | PRN
  Administered 2021-04-15: 2 mg via ORAL
  Filled 2021-04-15: qty 2

## 2021-04-15 MED FILL — Verapamil HCl IV Soln 2.5 MG/ML: INTRAVENOUS | Qty: 2 | Status: AC

## 2021-04-15 NOTE — Progress Notes (Addendum)
Dr. Denese Killings at bedside and gave order for nurse to perform bedside swallow evaluation and to discontinue IV fluids.

## 2021-04-15 NOTE — Progress Notes (Signed)
Sent Dr. Denese Killings secure chat and let MD know that patients BP is soft 107/47 and that metoprolol and cozaar are both due. MD gave order to give metoprolol and to hold cozaar.

## 2021-04-15 NOTE — H&P (Signed)
NAME:  [name], MRN:  [id number], DOB:  08/29/1980, LOS: 1 ADMISSION DATE:  04/13/2021, CONSULTATION DATE:  04/15/21 REFERRING MD:  Randal Buba, CHIEF COMPLAINT:  cardiac arrest   History of Present Illness:  Joe Miranda is a 41 y.o. M with PMH of reported heroin abuse who was found unresponsive by his mother and in PEA arrest on EMS arrival.  Approximately 5-10 minutes before EMS arrival, received 15 minutes CPR, 1 epi and 3 defibrillations and converted to vfib. He was intubated in the ED, per report eyes were open but no purposeful movements.   EKG consistent with STEMI and pt to be taken to the cath lab. CT head negative. PCCM consulted for admission.   He was recently in the ED c/o chest pain, but left from the waiting room before he was evaluated.   Pertinent  Medical History   Past Medical History:  Diagnosis Date   Asthma    Heroin abuse ([place])     Significant Hospital Events: Including procedures, antibiotic start and stop dates in addition to other pertinent events   1/18 brought in by EMS post-arrest, intubated and taken to the cath lab 1/18 99% thrombotic proximal LAD with TIMI grade II flow, treated with a 3.5 x 26 drug-eluting stent postdilated to 4.0 mm throughout the stented segment.  TIMI grade III flow reestablished. 1/18 Moderately severe LV systolic dysfunction with EF estimated 25 to 30% with anteroapical akinesis and other walls hypokinetic.  LVEDP 15 mmHg. 1/18 severe agitated delirium requiring restraints, one-to-one sitter and Precedex infusion.  Interim History / Subjective:    Calmer and more cooperative today but has limited insight into his condition repeatedly asked to go home.  Objective   Blood pressure 121/77, pulse 61, temperature 98.3 F (36.8 C), temperature source Oral, resp. rate 14, height 5\' 9"  (1.753 m), weight 93.6 kg, SpO2 96 %.        Intake/Output Summary (Last 24 hours) at 04/15/2021 1034 Last data filed at 04/15/2021 1000 Gross  per 24 hour  Intake 2663.44 ml  Output 700 ml  Net 1963.44 ml    Filed Weights   04/14/21 0000 04/14/21 0500 04/15/21 0600  Weight: 81.6 kg 93.2 kg 93.6 kg    General:  well-nourished M, in no distress HEENT: MM pink/moist, sclera anicteric, pupils equal  Neuro: Calm exam is nonfocal.  Converses normally. CV: In sinus rhythm.  Normal S1-S2.  S3 present.  No peripheral edema.  No JVD. PULM: Chest is clear to auscultation bilaterally GI: soft, non-distended GU: Voiding clear urine Extremities: warm/dry, no edema  Skin: no rashes or lesions  Ancillary tests personally reviewed:   Creatinine has now normalized at 1.08 Mild transaminitis continues to improve Echocardiogram shows EF 25%.  Anterior akinesis/dyskinesis but no evidence of clot.  Assessment & Plan:  Active Problems:   Cardiac arrest with pulseless electrical activity (HCC)   Respiratory arrest ([place])   AKI (acute kidney injury) ([place])   Shock liver   Acute ST elevation myocardial infarction (STEMI) involving left anterior descending (LAD) coronary artery without development of Q waves (HCC)   CAD S/P percutaneous coronary angioplasty   Severe left ventricular systolic dysfunction   Delirium due to another medical condition, acute, hyperactive  Other problems Asthma by history  Heroin abuse history  Plan:  -Wean Precedex to off if possible. Continue PRN sedation necessary.  Patient likely has some degree of frontal disinhibition following cardiac arrest.  Needs OT evaluation and may need neurocognitive rehabilitation. -Stop  amiodarone today. -Patient is now cleared to swallow we will start guideline directed post MI treatment.  We will start Toprol-XL 25 daily and losartan 25 mg daily.  Will clarify medication coverage prior to initiating SGLT2 -Plan for LifeVest on discharge -Transition to Brilinta and ASA.  No anticoagulation at this time.  Continue statin  Best Practice (right click and "Reselect all SmartList  Selections" daily)   Diet/type: Transition to regular diet. DVT prophylaxis: Lovenox GI prophylaxis: Not indicated Lines: N/A Foley: Removed Code Status:  full code Last date of multidisciplinary goals of care discussion [family updated by cardiology and by Dr. Lynetta Mare 1/18]    Kipp Brood, MD [facility] ICU Physician [place]  Pager: [phone] Or Epic Secure Chat After hours: [phone].  04/15/2021, 10:34 AM
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
## 2021-04-15 NOTE — Progress Notes (Signed)
Heart Failure Nurse Navigator Progress Note  Following this hospitalization to assess for HV TOC readiness. Pt resting in bed on room air eating lunch. Mother and stepfather at bedside. Pt stated several times he is "ready to go". Does not comprehend the severity of his illness. Mother states she will help him do everything he is suppose to do--take medications, follow up appointments, therapy, etc.   Will visit again tomorrow to assess mental/cognitive status. Will plan for [facility] clinic appt upon discharge. Many new HF medications initiated--medication cost concerns as pt does not have insurance.   Pricilla Holm, MSN, RN Heart Failure Nurse Navigator [phone]

## 2021-04-15 NOTE — Evaluation (Signed)
Occupational Therapy Evaluation Patient Details Name: Joe Miranda MRN: [id number] DOB: 02/20/1981 Today's Date: 04/15/2021   History of Present Illness 41 yo male presenting to ED on 1/17 with unwitnessed PEA arrest. Required 10 min CPR. Intubated in ED on 1/17. Found to have STEMI. S/p PCI to LAD.  PMH including asthma and heroin abuse.    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 Pt currently requiring Min A for ADLs and functional mobility. Pt presenting with poor balance and cognition impacting his safe performance of ADLs. Pt with LOB during functional mobility and poor awareness requiring at least Min A for correction and fall prevention. Pt demonstrating poor ST memory, problem solving, awareness, and executive functioning. Pt will require further acute OT to continue to address cognition in preparation for dc to home. Recommend dc to home with follow up at neuro OP for return to PLOF and independence.      Recommendations for follow up therapy are one component of a multi-disciplinary discharge planning process, led by the attending physician.  Recommendations may be updated based on patient status, additional functional criteria and insurance authorization.   Follow Up Recommendations  Outpatient OT (neuro)    Assistance Recommended at Discharge Frequent or constant Supervision/Assistance  Patient can return home with the following A little help with walking and/or transfers;A little help with bathing/dressing/bathroom;Direct supervision/assist for medications management;Direct supervision/assist for financial management    Functional Status Assessment  Patient has had a recent decline in their functional status and demonstrates the ability to make significant improvements in function in a reasonable and predictable amount of time.  Equipment Recommendations  None recommended by OT    Recommendations for  Other Services PT consult     Precautions / Restrictions Precautions Precautions: Fall      Mobility Bed Mobility Overal bed mobility: Needs Assistance Bed Mobility: Supine to Sit     Supine to sit: Supervision     General bed mobility comments: Supervision for safety    Transfers Overall transfer level: Needs assistance Equipment used: None Transfers: Sit to/from Stand Sit to Stand: Min assist           General transfer comment: Min A for correcting LOB in standing      Balance Overall balance assessment: Needs assistance Sitting-balance support: No upper extremity supported, Feet supported Sitting balance-Leahy Scale: Fair     Standing balance support: No upper extremity supported, During functional activity Standing balance-Leahy Scale: Poor Standing balance comment: Reliant on phsyical A for correcting LOB                           ADL either performed or assessed with clinical judgement   ADL Overall ADL's : Needs assistance/impaired Eating/Feeding: Set up;Sitting   Grooming: Minimal assistance;Standing   Upper Body Bathing: Minimal assistance;Sitting   Lower Body Bathing: Minimal assistance;Sit to/from stand   Upper Body Dressing : Minimal assistance;Sitting   Lower Body Dressing: Minimal assistance;Sit to/from stand   Toilet Transfer: Minimal assistance;Ambulation (simulated to recliner)           Functional mobility during ADLs: Minimal assistance General ADL Comments: Pt presenting with decreased balance, cognition, and safety. presents similar to an anoxic BI. Educating pt and mother that he will need assistance at home with IADLs as his ST memory deficits will make these activites difficult and possibly unsafe. Educating on use of  external memory aides such as a schedule, to do list, or alarms.     Vision         Perception     Praxis      Pertinent Vitals/Pain Pain Assessment Pain Assessment: Faces Faces Pain  Scale: Hurts even more Pain Location: chest Pain Descriptors / Indicators: Grimacing Pain Intervention(s): Monitored during session, Limited activity within patient's tolerance, Repositioned, Premedicated before session     Hand Dominance Right   Extremity/Trunk Assessment Upper Extremity Assessment Upper Extremity Assessment: Overall WFL for tasks assessed   Lower Extremity Assessment Lower Extremity Assessment: Defer to PT evaluation   Cervical / Trunk Assessment Cervical / Trunk Assessment: Normal   Communication Communication Communication: No difficulties   Cognition Arousal/Alertness: Awake/alert Behavior During Therapy: Restless Overall Cognitive Status: Impaired/Different from baseline Area of Impairment: Orientation, Attention, Memory, Following commands, Safety/judgement, Awareness, Problem solving                 Orientation Level: Disoriented to, Situation, Time Current Attention Level: Selective, Sustained Memory: Decreased short-term memory, Decreased recall of precautions Following Commands: Follows one step commands inconsistently, Follows one step commands with increased time Safety/Judgement: Decreased awareness of deficits, Decreased awareness of safety Awareness: Intellectual Problem Solving: Slow processing, Difficulty sequencing, Requires verbal cues General Comments: Significant ST memory deficits. Forgetting information within 30 sec. Unable to recall 0/3 ST memory words. Requiring Max cues to recall which room was his during path finding. Able to problem solve and locate his room with cues for recall of number. Poor awareness of deficits despite demonstrations such as patient loosing his balance udirng mobility, asked him about his balance a minutes later, and he says he is fine.     General Comments  HR 60-80s    Exercises     Shoulder Instructions      Home Living Family/patient expects to be discharged to:: Private residence Living  Arrangements: Parent (mother) Available Help at Discharge: Family Type of Home: House Home Access: Stairs to enter Secretary/administratorntrance Stairs-Number of Steps: 4 Entrance Stairs-Rails: Can reach both Home Layout: One level     Bathroom Shower/Tub: Tub/shower unit         Home Equipment: Shower seat;Hand held shower head          Prior Functioning/Environment Prior Level of Function : Independent/Modified Independent               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        OT Problem List: Decreased strength;Decreased range of motion;Decreased activity tolerance;Impaired balance (sitting and/or standing);Decreased knowledge of use of DME or AE;Decreased knowledge of precautions;Pain      OT Treatment/Interventions: Self-care/ADL training;Therapeutic exercise;Energy conservation;DME and/or AE instruction;Therapeutic activities;Balance training    OT Goals(Current goals can be found in the care plan section) Acute Rehab OT Goals Patient Stated Goal: "I gotta get me a cigarette" OT Goal Formulation: With patient Time For Goal Achievement: 04/29/21 Potential to Achieve Goals: Good  OT Frequency: Min 3X/week    Co-evaluation              AM-PAC OT "6 Clicks" Daily Activity     Outcome Measure Help from another person eating meals?: A Little Help from another person taking care of personal grooming?: A Little Help from another person toileting, which includes using toliet, bedpan, or urinal?: A Little Help from another person bathing (including washing, rinsing, drying)?: A Little Help from another person to put on and taking off regular upper body clothing?:  A Little Help from another person to put on and taking off regular lower body clothing?: A Little 6 Click Score: 18   End of Session Equipment Utilized During Treatment: Gait belt Nurse Communication: Mobility status (Notified RN that pt wanted to sit in recliner but there is not a chair alarm; RN will check  back)  Activity Tolerance: Patient tolerated treatment well Patient left: in chair;with call bell/phone within reach;with family/visitor present  OT Visit Diagnosis: Unsteadiness on feet (R26.81);Other abnormalities of gait and mobility (R26.89);Muscle weakness (generalized) (M62.81);Pain Pain - part of body:  (chest)                Time: 1610-9604 OT Time Calculation (min): 25 min Charges:  OT General Charges $OT Visit: 1 Visit OT Evaluation $OT Eval Moderate Complexity: 1 Mod OT Treatments $Self Care/Home Management : 8-22 mins  Siham Bucaro MSOT, OTR/L Acute Rehab Pager: [phone] Office: [phone]  Theodoro Grist Sunjai Levandoski 04/15/2021, 4:29 PM

## 2021-04-15 NOTE — Progress Notes (Signed)
Progress Note  Patient Name: Joe Miranda Date of Encounter: 04/15/2021  [facility] Cardiologist: Dr. Pernell Dupre  Subjective   Postop day 1 VF arrest, resuscitated, PCI proximal LAD.  Patient self extubated yesterday.  He is alert and following commands this morning.  He denies chest pain.  Inpatient Medications    Scheduled Meds:  acetaminophen  650 mg Oral Q4H   Or   acetaminophen (TYLENOL) oral liquid 160 mg/5 mL  650 mg Per Tube Q4H   Or   acetaminophen  650 mg Rectal Q4H   aspirin  81 mg Per Tube Daily   atorvastatin  80 mg Per Tube Daily   chlorhexidine gluconate (MEDLINE KIT)  15 mL Mouth Rinse BID   Chlorhexidine Gluconate Cloth  6 each Topical Daily   docusate  100 mg Per Tube BID   enoxaparin (LOVENOX) injection  40 mg Subcutaneous Q24H   haloperidol  5 mg Oral STAT   haloperidol lactate  2.5 mg Intravenous Q6H   insulin aspart  0-9 Units Subcutaneous Q4H   LORazepam  2 mg Intravenous Q6H   mouth rinse  15 mL Mouth Rinse BID   pantoprazole sodium  40 mg Per Tube Daily   polyethylene glycol  17 g Per Tube Daily   sodium chloride flush  3 mL Intravenous Q12H   Continuous Infusions:  sodium chloride     sodium chloride 250 mL (04/14/21 0148)   sodium chloride 100 mL/hr at 04/15/21 0700   amiodarone 30 mg/hr (04/15/21 0700)   cangrelor 50 mg in NS 250 mL 0.75 mcg/kg/min (04/15/21 0700)   dexmedetomidine (PRECEDEX) IV infusion 0.6 mcg/kg/hr (04/15/21 0700)   fentaNYL infusion INTRAVENOUS Stopped (04/14/21 0830)   magnesium sulfate     midazolam 6.5 mg/hr (04/14/21 0445)   PRN Meds: sodium chloride, acetaminophen **OR** acetaminophen (TYLENOL) oral liquid 160 mg/5 mL **OR** acetaminophen, busPIRone **OR** busPIRone, etomidate, magnesium sulfate, ondansetron (ZOFRAN) IV, polyethylene glycol, rocuronium, sodium chloride flush   Vital Signs    Vitals:   04/15/21 0400 04/15/21 0500 04/15/21 0600 04/15/21 0700  BP: 112/77 (!) 93/59 106/75 104/74  Pulse:  60 (!) 59 (!) 59 (!) 56  Resp: _0 Temp:      TempSrc:      SpO2: 95% 99% 100% 97%  Weight:   93.6 kg   Height:        Intake/Output Summary (Last 24 hours) at 04/15/2021 0801 Last data filed at 04/15/2021 0700 Gross per 24 hour  Intake 2540.83 ml  Output 760 ml  Net 1780.83 ml   Last 3 Weights 04/15/2021 04/14/2021 04/14/2021  Weight (lbs) 206 lb 5.6 oz 205 lb 7.5 oz 180 lb  Weight (kg) 93.6 kg 93.2 kg 81.647 kg      Telemetry    Normal sinus rhythm - Personally Reviewed  ECG    Not performed today- Personally Reviewed  Physical Exam   GEN: No acute distress.   Neck: No JVD Cardiac: RRR, no murmurs, rubs, or gallops.  Respiratory: Clear to auscultation bilaterally. GI: Soft, nontender, non-distended  MS: No edema; No deformity. Neuro: Just extubated, confused Psych: Normal affect   Labs    High Sensitivity Troponin:   Recent Labs  Lab 04/14/21 0240 04/14/21 0610 04/14/21 0810  TROPONINIHS 3,159* 17,090* 16,243*     Chemistry Recent Labs  Lab 04/13/21 2304 04/13/21 2309 04/14/21 0240 04/14/21 0305 04/14/21 1656 04/15/21 0055  NA 142   < > 142 140 142 142  K 3.9   < > 4.1 4.0 4.3 4.3  CL 108   < > 110  --  111 113*  CO2 16*  --  19*  --  23 22  GLUCOSE 252*   < > 166*  --  113* 117*  BUN 16   < > 14  --  14 12  CREATININE 1.59*   < > 1.32*  --  1.11 1.08  CALCIUM 9.0  --  9.2  --  8.5* 8.5*  MG 2.1  --   --   --   --   --   PROT 6.2*  --  6.5  --   --  5.0*  ALBUMIN 4.2  --  4.2  --   --  3.0*  AST 353*  --  360*  --   --  149*  ALT 474*  --  437*  --   --  219*  ALKPHOS 64  --  62  --   --  46  BILITOT 0.7  --  0.8  --   --  0.8  GFRNONAA 56*  --  >60  --  >60 >60  ANIONGAP 18*  --  13  --  8 7   < > = values in this interval not displayed.    Lipids No results for input(s): CHOL, TRIG, HDL, LABVLDL, LDLCALC, CHOLHDL in the last 168 hours.  Hematology Recent Labs  Lab 04/13/21 2304 04/13/21 2309 04/14/21 0240 04/14/21 0305  04/15/21 0055  WBC 12.8*  --  16.2*  --  11.8*  RBC 4.85  --  5.00  --  3.96*  HGB 15.5   < > 16.5 15.0 13.1  HCT 50.5   < > 48.6 44.0 38.5*  MCV 104.1*  --  97.2  --  97.2  MCH 32.0  --  33.0  --  33.1  MCHC 30.7  --  34.0  --  34.0  RDW 13.7  --  13.5  --  13.7  PLT 338  --  355  --  288   < > = values in this interval not displayed.   Thyroid No results for input(s): TSH, FREET4 in the last 168 hours.  BNPNo results for input(s): BNP, PROBNP in the last 168 hours.  DDimer No results for input(s): DDIMER in the last 168 hours.   Radiology    CT HEAD WO CONTRAST (5MM)  Result Date: 04/13/2021 CLINICAL DATA:  Facial trauma, blunt. EXAM: CT HEAD WITHOUT CONTRAST TECHNIQUE: Contiguous axial images were obtained from the base of the skull through the vertex without intravenous contrast. RADIATION DOSE REDUCTION: This exam was performed according to the departmental dose-optimization program which includes automated exposure control, adjustment of the mA and/or kV according to patient size and/or use of iterative reconstruction technique. COMPARISON:  None. FINDINGS: Brain: No acute intracranial hemorrhage, midline shift or mass effect. No extra-axial fluid collection. Gray-white matter differentiation is within normal limits. No hydrocephalus. Vascular: No hyperdense vessel or unexpected calcification. Skull: Normal. Negative for fracture or focal lesion. Sinuses/Orbits: No acute finding. Other: Tubes are present in the oropharynx. IMPRESSION: No acute intracranial process. Electronically Signed   By: Brett Fairy M.D.   On: 04/13/2021 23:47   CARDIAC CATHETERIZATION  Result Date: 04/14/2021 CONCLUSIONS: Out of hospital cardiac arrest requiring 20 minutes of CPR to achieve ROSC.  Initial rhythm was pulseless electrical activity. 99% thrombotic proximal LAD with TIMI grade II flow, treated with a 3.5 x 26 drug-eluting  stent postdilated to 4.0 mm throughout the stented segment.  TIMI grade III  flow reestablished. Normal left main Normal circumflex Normal RCA Moderately severe LV systolic dysfunction with EF estimated 25 to 30% with anteroapical akinesis and other walls hypokinetic.  LVEDP 15 mmHg. RECOMMENDATIONS: Brilinta given as a slurry into the patient's NG tube. A bolus followed by an infusion of Aggrastat was started and will run for 4 hours. Critical care medicine will comanage patient. Guarded prognosis discussed with the patient and family relative to neurological recovery and survival. Statin per injury Subcu Lovenox for DVT prophylaxis  DG Chest Port 1 View  Result Date: 04/13/2021 CLINICAL DATA:  Post CPR. EXAM: PORTABLE CHEST 1 VIEW COMPARISON:  Chest x-ray 03/02/2013. FINDINGS: Endotracheal tube tip is 2.5 cm above the carina. Enteric tube tip is in the mid stomach. Lines and tubes overlie the chest. The cardiomediastinal silhouette is within normal limits for projection. There is airspace disease in the medial right upper lung. Costophrenic angles are clear. No pneumothorax. No acute fractures are identified. IMPRESSION: 1. Medial right upper lobe airspace disease. 2. Lines and tubes as above. Electronically Signed   By: Ronney Asters M.D.   On: 04/13/2021 23:42   ECHOCARDIOGRAM COMPLETE  Result Date: 04/14/2021    ECHOCARDIOGRAM REPORT   Patient Name:   Stevie Henrickson Date of Exam: 04/14/2021 Medical Rec #:  [id number]       Height:       69.0 in Accession #:    [id number]      Weight:       205.5 lb Date of Birth:  04/16/1980       BSA:          2.090 m Patient Age:    16 years        BP:           132/85 mmHg Patient Gender: M               HR:           81 bpm. Exam Location:  Inpatient Procedure: 2D Echo and Strain Analysis Indications:    cardiac arrest  History:        Patient has no prior history of Echocardiogram examinations.                 CAD; Risk Factors:Current Smoker.  Sonographer:    Johny Chess RDCS Referring Phys: [id number] Francesca Jewett  Sonographer  Comments: Echo performed with patient supine and on artificial respirator. IMPRESSIONS  1. Left ventricular ejection fraction, by estimation, is 20 to 25%. The left ventricle has severely decreased function. The left ventricle demonstrates regional wall motion abnormalities (see scoring diagram/findings for description). There is mild concentric left ventricular hypertrophy. Left ventricular diastolic parameters are consistent with Grade I diastolic dysfunction (impaired relaxation). The average left ventricular global longitudinal strain is -5.0 %. The global longitudinal strain is abnormal.  2. Right ventricular systolic function is normal. The right ventricular size is normal. There is normal pulmonary artery systolic pressure.  3. The mitral valve is normal in structure. Trivial mitral valve regurgitation. No evidence of mitral stenosis.  4. The aortic valve is tricuspid. Aortic valve regurgitation is not visualized. No aortic stenosis is present.  5. The inferior vena cava is normal in size with <50% respiratory variability, suggesting right atrial pressure of 8 mmHg. FINDINGS  Left Ventricle: Left ventricular ejection fraction, by estimation, is 20 to 25%. The left ventricle has severely decreased function.  The left ventricle demonstrates regional wall motion abnormalities. The average left ventricular global longitudinal strain is -5.0 %. The global longitudinal strain is abnormal. The left ventricular internal cavity size was normal in size. There is mild concentric left ventricular hypertrophy. Left ventricular diastolic parameters are consistent with Grade I diastolic  dysfunction (impaired relaxation). Normal left ventricular filling pressure.  LV Wall Scoring: The mid and distal anterior septum, mid inferoseptal segment, apical anterior segment, and apex are akinetic. The basal anteroseptal segment, apical lateral segment, mid anterior segment, apical inferior segment, and basal inferoseptal segment are  hypokinetic. The antero-lateral wall, inferior wall, posterior wall, and basal anterior segment are normal. Right Ventricle: The right ventricular size is normal. No increase in right ventricular wall thickness. Right ventricular systolic function is normal. There is normal pulmonary artery systolic pressure. The tricuspid regurgitant velocity is 2.08 m/s, and  with an assumed right atrial pressure of 8 mmHg, the estimated right ventricular systolic pressure is 02.1 mmHg. Left Atrium: Left atrial size was normal in size. Right Atrium: Right atrial size was normal in size. Pericardium: There is no evidence of pericardial effusion. Mitral Valve: The mitral valve is normal in structure. Trivial mitral valve regurgitation. No evidence of mitral valve stenosis. Tricuspid Valve: The tricuspid valve is normal in structure. Tricuspid valve regurgitation is trivial. No evidence of tricuspid stenosis. Aortic Valve: The aortic valve is tricuspid. Aortic valve regurgitation is not visualized. No aortic stenosis is present. Pulmonic Valve: The pulmonic valve was normal in structure. Pulmonic valve regurgitation is not visualized. No evidence of pulmonic stenosis. Aorta: The aortic root is normal in size and structure. Venous: The inferior vena cava is normal in size with less than 50% respiratory variability, suggesting right atrial pressure of 8 mmHg. IAS/Shunts: No atrial level shunt detected by color flow Doppler.  LEFT VENTRICLE PLAX 2D LVIDd:         4.50 cm   Diastology LVIDs:         3.60 cm   LV e' medial:    6.64 cm/s LV PW:         1.10 cm   LV E/e' medial:  5.9 LV IVS:        1.10 cm   LV e' lateral:   8.05 cm/s LVOT diam:     2.20 cm   LV E/e' lateral: 4.8 LV SV:         47 LV SV Index:   22        2D Longitudinal Strain LVOT Area:     3.80 cm  2D Strain GLS (A2C):   -4.6 %                          2D Strain GLS (A3C):   -5.3 %                          2D Strain GLS (A4C):   -5.0 %                          2D Strain  GLS Avg:     -5.0 % RIGHT VENTRICLE             IVC RV S prime:     11.60 cm/s  IVC diam: 2.00 cm TAPSE (M-mode): 1.4 cm LEFT ATRIUM             Index  RIGHT ATRIUM           Index LA diam:        3.50 cm 1.67 cm/m   RA Area:     14.30 cm LA Vol (A2C):   42.9 ml 20.53 ml/m  RA Volume:   37.10 ml  17.75 ml/m LA Vol (A4C):   53.0 ml 25.36 ml/m LA Biplane Vol: 52.7 ml 25.22 ml/m  AORTIC VALVE LVOT Vmax:   76.40 cm/s LVOT Vmean:  53.000 cm/s LVOT VTI:    0.123 m  AORTA Ao Root diam: 3.10 cm MITRAL VALVE               TRICUSPID VALVE MV Area (PHT): 2.95 cm    TR Peak grad:   17.3 mmHg MV Decel Time: 257 msec    TR Vmax:        208.00 cm/s MV E velocity: 39.00 cm/s MV A velocity: 49.70 cm/s  SHUNTS MV E/A ratio:  0.78        Systemic VTI:  0.12 m                            Systemic Diam: 2.20 cm Skeet Latch MD Electronically signed by Skeet Latch MD Signature Date/Time: 04/14/2021/11:32:21 AM    Final     Cardiac Studies   Cardiac catheterization/PCI drug-eluting stent (04/14/2021)  Conclusion  CONCLUSIONS: Out of hospital cardiac arrest requiring 20 minutes of CPR to achieve ROSC.  Initial rhythm was pulseless electrical activity. 99% thrombotic proximal LAD with TIMI grade II flow, treated with a 3.5 x 26 drug-eluting stent postdilated to 4.0 mm throughout the stented segment.  TIMI grade III flow reestablished. Normal left main Normal circumflex Normal RCA Moderately severe LV systolic dysfunction with EF estimated 25 to 30% with anteroapical akinesis and other walls hypokinetic.  LVEDP 15 mmHg.   RECOMMENDATIONS:   Brilinta given as a slurry into the patient's NG tube. A bolus followed by an infusion of Aggrastat was started and will run for 4 hours. Critical care medicine will comanage patient. Guarded prognosis discussed with the patient and family relative to neurological recovery and survival. Statin per injury Subcu Lovenox for DVT prophylaxis  Coronary  Diagrams  Diagnostic Dominance: Right Intervention   2D echocardiogram (04/14/2021)   1. Left ventricular ejection fraction, by estimation, is 20 to 25%. The  left ventricle has severely decreased function. The left ventricle  demonstrates regional wall motion abnormalities (see scoring  diagram/findings for description). There is mild  concentric left ventricular hypertrophy. Left ventricular diastolic  parameters are consistent with Grade I diastolic dysfunction (impaired  relaxation). The average left ventricular global longitudinal strain is  -5.0 %. The global longitudinal strain is  abnormal.   2. Right ventricular systolic function is normal. The right ventricular  size is normal. There is normal pulmonary artery systolic pressure.   3. The mitral valve is normal in structure. Trivial mitral valve  regurgitation. No evidence of mitral stenosis.   4. The aortic valve is tricuspid. Aortic valve regurgitation is not  visualized. No aortic stenosis is present.   5. The inferior vena cava is normal in size with <50% respiratory  variability, suggesting right atrial pressure of 8 mmHg.   Patient Profile     41 y.o. male African-American male who apparently was seen in the emergency room last month with chest pain and left prior to being evaluated.  He is been having off-and-on chest  pain since.  He had witnessed arrest last night and had bystander CPR by his girlfriend and ultimately by first responders with ROSC of approximately 15 minutes.  He was brought to the emergency room where his EKG was consistent with "anterior STEMI" and was brought emergently to the Cath Lab.  Assessment & Plan    1: Anterior STEMI-witnessed cardiac arrest/sudden cardiac death with ROSC in 15 minutes.  Urgent cath revealed a subtotally occluded proximal LAD with ruptured plaque and TIMI I-II flow.  The remainder of his coronary artery was free of significant disease.  He had anteroapical severe  hypokinesia with estimated EF visually of approximately 30%.  He had DES placed successfully with restoration of TIMI-3 flow.  He is on dual antiplatelet therapy including aspirin Brilinta which he received via "OG tube".  The patient self extubated this morning and appears to be alert and oriented and responsive, following commands.  He currently is getting cangrelor because of inability to swallow reliably.  2: Ischemic cardiomyopathy-EF 20% by 2D echo.  Once he passes his swallowing test we will initiate guideline directed optimal medical therapy for LV dysfunction based on his blood pressure and pulse.  Optimally, he would benefit from application of LifeVest for 3 months with reassessment of LV function and transitioning to an ICD depending on his LV function.      For questions or updates, please contact [place] Please consult [email or web address] for contact info under        Signed, Quay Burow, MD  04/15/2021, 8:01 AM
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
## 2021-04-15 NOTE — Progress Notes (Signed)
Mostly cooperative during shift. Alert to person and place but confused to situation. Frequently stating he wants to go home but cooperative with the encouragement of his mom. Precedex off at 1330 and oral ativan given once for restlessness and anxiety.

## 2021-04-16 ENCOUNTER — Telehealth (HOSPITAL_COMMUNITY): Payer: Self-pay

## 2021-04-16 ENCOUNTER — Telehealth: Payer: Self-pay | Admitting: Interventional Cardiology

## 2021-04-16 LAB — CULTURE, RESPIRATORY W GRAM STAIN: Culture: NORMAL

## 2021-04-16 NOTE — Telephone Encounter (Signed)
Spoke with pt's mother who states pt is not with her at this time.  Pt's mother advised she is not listed as DPR on pt's record and will need verbal permission to discuss pt with her. Pt's mother states Dr Tamala Julian called her earlier and she has some additional questions for him related to her son. Advised RN will let Dr Tamala Julian know she has called back.

## 2021-04-16 NOTE — Telephone Encounter (Signed)
Spoke with Dr Katrinka Blazing who states he did speak with pt's mother earlier and advised her to have son return to ICU as he left yesterday AMA.  Dr Katrinka Blazing says he has nothing further to discuss other than to advise the pt to return to the hospital. RN attempted phone call to pt at [phone] to advise of Dr Michaelle Copas recommendation.  Left voicemail message to contact triage at [phone].

## 2021-04-16 NOTE — Telephone Encounter (Signed)
Cregg's mother would like to speak to Dr. Katrinka Blazing.  She states he had called her earlier today.

## 2021-04-16 NOTE — Telephone Encounter (Signed)
Heart Failure Nurse Navigator Progress Note  Pt left hospitalization 1/19 AMA. Some aggressive factors noted. Concern for safe/adequate self care after PEA arrest.   Spoke with pt mother at bedside yesterday, able to call her today regarding follow up plan. Mother picked son up yesterday around 8pm.  Mother had concern for patient and gave him tylenol as he is complaining of pain, asked if he could have some Bayer Asprin--relayed medication advice is out of this writers scope of practice. Mother states she has already called Dr. Michaelle Copas office, awaiting return call.  Since patient left AMA, he does not have medications, no follow up appointments, plan was for potential life vest upon DC.    Scheduled HV TOC clinic appt for Monday, January 23 @ 2pm. Mother states she will try her best to get him there and is very hopefully he will attend. Encouraged her to not let patient drink alcohol, smoke or partake in illicit drug use over the weekend.   ECHO/EF: 20-25%, mild LVH, G1DD.    Ozella Rocks, MSN, RN Heart Failure Nurse Navigator [phone]

## 2021-04-19 ENCOUNTER — Telehealth (HOSPITAL_COMMUNITY): Payer: Self-pay

## 2021-04-19 ENCOUNTER — Other Ambulatory Visit: Payer: Self-pay

## 2021-04-19 ENCOUNTER — Ambulatory Visit (HOSPITAL_COMMUNITY)
Admission: RE | Admit: 2021-04-19 | Discharge: 2021-04-19 | Disposition: A | Discharge status: Home / Self Care | Payer: 59 | Source: Ambulatory Visit | Attending: Adult Health | Admitting: Adult Health

## 2021-04-19 ENCOUNTER — Other Ambulatory Visit (HOSPITAL_COMMUNITY): Payer: Self-pay

## 2021-04-19 ENCOUNTER — Encounter (HOSPITAL_COMMUNITY): Payer: Self-pay

## 2021-04-19 VITALS — BP 142/80 | HR 55 | Wt 213.4 lb

## 2021-04-19 DIAGNOSIS — Z720 Tobacco use: Secondary | ICD-10-CM

## 2021-04-19 DIAGNOSIS — F17210 Nicotine dependence, cigarettes, uncomplicated: Secondary | ICD-10-CM | POA: Diagnosis not present

## 2021-04-19 DIAGNOSIS — J45909 Unspecified asthma, uncomplicated: Secondary | ICD-10-CM | POA: Diagnosis not present

## 2021-04-19 DIAGNOSIS — I5022 Chronic systolic (congestive) heart failure: Secondary | ICD-10-CM

## 2021-04-19 DIAGNOSIS — F1410 Cocaine abuse, uncomplicated: Secondary | ICD-10-CM

## 2021-04-19 DIAGNOSIS — Z9861 Coronary angioplasty status: Secondary | ICD-10-CM

## 2021-04-19 DIAGNOSIS — I2510 Atherosclerotic heart disease of native coronary artery without angina pectoris: Secondary | ICD-10-CM

## 2021-04-19 LAB — BASIC METABOLIC PANEL
Anion gap: 7 (ref 5–15)
BUN: 16 mg/dL (ref 6–20)
CO2: 27 mmol/L (ref 22–32)
Calcium: 8.8 mg/dL — ABNORMAL LOW (ref 8.9–10.3)
Chloride: 107 mmol/L (ref 98–111)
Creatinine, Ser: 0.96 mg/dL (ref 0.61–1.24)
GFR, Estimated: >60 mL/min (ref >60)
Glucose, Bld: 94 mg/dL (ref 70–99)
Potassium: 4.3 mmol/L (ref 3.5–5.1)
Sodium: 141 mmol/L (ref 135–145)

## 2021-04-19 MED ORDER — SACUBITRIL-VALSARTAN 24-26 MG PO TABS
1.0000 | ORAL_TABLET | Freq: Two times a day (BID) | ORAL | Status: DC
Start: 2021-04-19 — End: 2021-04-19

## 2021-04-19 MED ORDER — TICAGRELOR 90 MG PO TABS
90.0000 mg | ORAL_TABLET | Freq: Two times a day (BID) | ORAL | 6 refills | Status: DC
Start: 2021-04-19 — End: 2021-06-07
  Filled 2021-04-19: qty 60, 30d supply, fill #0

## 2021-04-19 MED ORDER — SACUBITRIL-VALSARTAN 24-26 MG PO TABS: 1.0000 | ORAL_TABLET | Freq: Two times a day (BID) | ORAL | Status: DC

## 2021-04-19 MED ORDER — ENTRESTO 24-26 MG PO TABS
1.0000 | ORAL_TABLET | Freq: Two times a day (BID) | ORAL | 6 refills | Status: DC
  Filled 2021-04-19: qty 60, 30d supply, fill #0

## 2021-04-19 MED ORDER — SPIRONOLACTONE 25 MG PO TABS
12.5000 mg | ORAL_TABLET | Freq: Every day | ORAL | 11 refills | Status: DC
  Filled 2021-04-19 – 2021-06-04 (×2): qty 15, 30d supply, fill #0
  Filled 2021-07-17: qty 15, 30d supply, fill #1
  Filled 2022-01-06: qty 15, 30d supply, fill #2

## 2021-04-19 MED ORDER — TICAGRELOR 90 MG PO TABS: 90.0000 mg | ORAL_TABLET | Freq: Two times a day (BID) | ORAL | Status: DC

## 2021-04-19 MED ORDER — ATORVASTATIN CALCIUM 80 MG PO TABS
80.0000 mg | ORAL_TABLET | Freq: Every day | ORAL | 11 refills | Status: DC
  Filled 2021-04-19 – 2021-06-04 (×2): qty 30, 30d supply, fill #0
  Filled 2021-07-05: qty 30, 30d supply, fill #1
  Filled 2022-01-06: qty 30, 30d supply, fill #2

## 2021-04-19 NOTE — Telephone Encounter (Signed)
Called to confirm Heart & Vascular Transitions of Care appointment at 2pm today. Patient reminded to bring all medications and pill box organizer with them. Confirmed patient has transportation. Gave directions, instructed to utilize valet parking.  Confirmed appointment prior to ending call.   Ellery Meroney, MSN, RN Heart Failure Nurse Navigator [phone]  

## 2021-04-19 NOTE — Progress Notes (Addendum)
HEART & VASCULAR TRANSITION OF CARE CONSULT NOTE     Referring Physician: Dr Allyson Sabal  Primary Care:None  Primary Cardiologist: Dr Katrinka Blazing    HPI: Referred to clinic by Dr Katrinka Blazing for heart failure consultation.   Joe Miranda is a 41 year old with a history of asthma, cocaine (quit 2 weeks ago) until recent admit. Recent admit had DES to proximal LAD and new HFrEF. Last used cocaine a week ago.    Prior to admit he had been having chest pain and had and ED visit in the fall but left before he was seen.   Admitted 04/13/21 after he was found unresponsive by his girlfriend. Had OOH PEA arrest.  CPR 20 min with ROSC. EKG with ST elevated V2-V4. Had emergency cath and underwent PCI /DES to proximal  LAD. Self extubated on 04/14/21.  Echo with severely reduced EF < 25%. Had frequent PVCs on the monitor. On 1/18 staff reported agitation but improved after haldol and ativan. On 04/15/21 he left AMA without any medications.   Today he arrived with his mother. He has only been taking tylenol and aspirin. Overall feeling ok but having chest soreness when he coughs. Denies SOB/PND/Orthopnea. Appetite ok. No fever or chills. Smoking 2-3 cigarettes per day. Has not used cocaine since discharge. He does not have a scale at home. 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 Smoking a few cigarettes. Lives with his girlfriend and 34 year old stepson.He has not medical insurance.    Cardiac Testing  Echo 04/14/21  EF 20-25%. RV normal. Grade I DD.   LHC 04/13/21 -LAD 99% proximal  PCI /DES . All other vessels ok.     Review of Systems: [y] = yes, [ ]  = no   General: Weight gain [ ] ; Weight loss [ ] ; Anorexia [ ] ; Fatigue [ ] ; Fever [ ] ; Chills [ ] ; Weakness [ Y]  Cardiac: Chest pain/pressure [ ] ; Resting SOB [ ] ; Exertional SOB [ ] ; Orthopnea [ ] ; Pedal Edema [ ] ; Palpitations [ ] ; Syncope [ ] ; Presyncope [ ] ; Paroxysmal nocturnal dyspnea[ ]   Pulmonary: Cough [ ] ; Wheezing[ ] ;  Hemoptysis[ ] ; Sputum [ ] ; Snoring [ ]   GI: Vomiting[ ] ; Dysphagia[ ] ; Melena[ ] ; Hematochezia [ ] ; Heartburn[ ] ; Abdominal pain [ ] ; Constipation [ ] ; Diarrhea [ ] ; BRBPR [ ]   GU: Hematuria[ ] ; Dysuria [ ] ; Nocturia[ ]   Vascular: Pain in legs with walking [ ] ; Pain in feet with lying flat [ ] ; Non-healing sores [ ] ; Stroke [ ] ; TIA [ ] ; Slurred speech [ ] ;  Neuro: Headaches[ ] ; Vertigo[ ] ; Seizures[ ] ; Paresthesias[ ] ;Blurred vision [ ] ; Diplopia [ ] ; Vision changes [ ]   Ortho/Skin: Arthritis [ ] ; Joint pain [ ] ; Muscle pain [ ] ; Joint swelling [ ] ; Back Pain [ ] ; Rash [ ]   Psych: Depression[ ] ; Anxiety[ ]   Heme: Bleeding problems [ ] ; Clotting disorders [ ] ; Anemia [ ]   Endocrine: Diabetes [ ] ; Thyroid dysfunction[ ]    Past Medical History:  Diagnosis Date   Asthma    Heroin abuse (HCC)     Current Outpatient Medications  Medication Sig Dispense Refill   acetaminophen (TYLENOL) 325 MG tablet Take 650 mg by mouth every 4 (four) hours as needed.     aspirin EC 81 MG tablet Take 81 mg by mouth daily. Swallow whole.     atorvastatin (LIPITOR) 80 MG tablet Take 1 tablet (80 mg  total) by mouth daily. 30 tablet 11   sacubitril-valsartan (ENTRESTO) 24-26 MG Take 1 tablet by mouth 2 (two) times daily. 60 tablet 6   spironolactone (ALDACTONE) 25 MG tablet Take 1/2 tablet (12.5 mg total) by mouth at bedtime. 30 tablet 11   ticagrelor (BRILINTA) 90 MG TABS tablet Take 1 tablet (90 mg total) by mouth 2 (two) times daily. 60 tablet 6   No current facility-administered medications for this encounter.    No Known Allergies    Social History   Socioeconomic History   Marital status: Single    Spouse name: Not on file   Number of children: Not on file   Years of education: Not on file   Highest education level: GED or equivalent  Occupational History   Not on file  Tobacco Use   Smoking status: Every Day    Packs/day: 1.00    Types: Cigarettes   Smokeless tobacco: Former  Substance and  Sexual Activity   Alcohol use: Not Currently   Drug use: Not Currently    Types: Marijuana, Cocaine   Sexual activity: Yes  Other Topics Concern   Not on file  Social History Narrative   Not on file   Social Determinants of Health   Financial Resource Strain: High Risk   Difficulty of Paying Living Expenses: Hard  Food Insecurity: No Food Insecurity   Worried About Running Out of Food in the Last Year: Never true   Ran Out of Food in the Last Year: Never true  Transportation Needs: No Transportation Needs   Lack of Transportation (Medical): No   Lack of Transportation (Non-Medical): No  Physical Activity: Not on file  Stress: Not on file  Social Connections: Not on file  Intimate Partner Violence: Not on file      Family History  Problem Relation Age of Onset   CVA Mother    Hypercholesterolemia Mother     Vitals:   04/19/21 1350  BP: (!) 142/80  Pulse: (!) 55  SpO2: 97%  Weight: 96.8 kg (213 lb 6.4 oz)    PHYSICAL EXAM: General:  Well appearing. No respiratory difficulty. Walked in the clinic.  HEENT: normal Neck: supple. no JVD. Carotids 2+ bilat; no bruits. No lymphadenopathy or thryomegaly appreciated. Cor: PMI nondisplaced. Regular rate & rhythm. No rubs, gallops or murmurs. Lungs: clear Abdomen: soft, nontender, nondistended. No hepatosplenomegaly. No bruits or masses. Good bowel sounds. Extremities: no cyanosis, clubbing, rash, edema Neuro: alert & oriented x 3, cranial nerves grossly intact. moves all 4 extremities w/o difficulty. Affect pleasant.  ECG: Sinus Brady 53 bpm personally reviewed.    ASSESSMENT & PLAN: Chronic HFrEF -Echo EF 20-25% RV normal. LHC with DES to LAD.  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  -NYHA II. Volume status stable.  - no bb with bradycardia and recent cocaine use - Start entresto 24-26 mg twice a day  - Start spironolactone 12.5 mg at bed time.  - Consider SLT2i next visit.  -He is adamant he does not want to  wear a life vest. We discussed at length.  - Plan to repeat ECHO in 3 months after HF meds optimized.  - Check BMET today.   2. CAD  04/14/21 --->S/P PCI DES to prox LAD. Of note he left AMA on 04/15/21 and did not have medications.  - Start high intensity statin -->atorvastatin 80 mg daily - Start Brillinta 90 mg twice a day - Start ASA 81 mg daily  - No chest pain.  3. Tobacco  - Discussed cessation   4. Cocaine Abuse -Discussed cessation. Last uses earlier this month. His mother is not aware of his recent cocaine use.   I suggested HF Paramedicine however he was not interested. I have asked him to bring all medications to his next appointment. We were able to send all meds to HF [facility] and provide him 30 days before he left the clinic.   Discussed with pharmacy team, HF Navigator, and HFSW  to determine plan for today and over the next 3 months.   Joe Miranda and his Mom verbalized understanding our recommendations. He was appreciative and thankful for the visit. We provided a scale and weight chart.   Referred to HFSW (PCP, Medications,Drug Abuse, Insurance, Financial ): Yes - SW started referral for [employer].  Refer to Pharmacy: Yes  Refer to Home Health:  No Refer to Advanced Heart Failure Clinic: Yes--->Dr Bensimhon Refer to General Cardiology: Shared.   Follow up in 7 days with pharmacy and 4 weeks with APP.   Marlisa Caridi NP-C  3:28 PM

## 2021-04-19 NOTE — Patient Instructions (Signed)
Continue Aspirin 81 mg daily  START Brilinta 90 mg Twice daily   START Atorvastin 80 mg daily  START Entresto 24/26 mg Twice daily  START Spironolactone 12.5 mg( 1/2 tablet) every night  Labs done today, your results will be available in MyChart, we will contact you for abnormal readings.  Please follow up with our heart failure pharmacist in 1 week  Your physician recommends that you schedule a follow-up appointment in: 3 weeks with app clinic  Scale provided please keep a log of daily weights  Please bring all medications to next appointment  If you have any questions or concerns before your next appointment please send [place] a message through [place] or call our office at [phone].    TO LEAVE A MESSAGE FOR THE NURSE SELECT OPTION 2, PLEASE LEAVE A MESSAGE INCLUDING: YOUR NAME DATE OF BIRTH CALL BACK NUMBER REASON FOR CALL**this is important as we prioritize the call backs  YOU WILL RECEIVE A CALL BACK THE SAME DAY AS LONG AS YOU CALL BEFORE 4:00 PM  At the Advanced Heart Failure Clinic, you and your health needs are our priority. As part of our continuing mission to provide you with exceptional heart care, we have created designated Provider Care Teams. These Care Teams include your primary Cardiologist (physician) and Advanced Practice Providers (APPs- Physician Assistants and Nurse Practitioners) who all work together to provide you with the care you need, when you need it.   You may see any of the following providers on your designated Care Team at your next follow up: Dr Arvilla Meres Dr Carron Curie, NP Robbie Lis, [place] [facility] [place], [place] Karle Plumber, PharmD   Please be sure to bring in all your medications bottles to every appointment.   Do the following things EVERYDAY: Weigh yourself in the morning before breakfast. Write it down and keep it in a log. Take your medicines as prescribed Eat low salt foods--Limit salt  (sodium) to 2000 mg per day.  Stay as active as you can everyday Limit all fluids for the day to less than 2 liters

## 2021-04-19 NOTE — Discharge Summary (Signed)
Physician Discharge Summary  Patient ID: Joe Miranda MRN: [id number] DOB/AGE: 1980-05-21 41 y.o.  Admit date: 04/13/2021 Discharge date: 04/19/2021  Admission Diagnoses:  Discharge Diagnoses:  Active Problems:   Cardiac arrest with pulseless electrical activity (HCC)   Respiratory arrest (HCC)   AKI (acute kidney injury) (HCC)   Shock liver   Acute ST elevation myocardial infarction (STEMI) involving left anterior descending (LAD) coronary artery without development of Q waves (HCC)   CAD S/P percutaneous coronary angioplasty   Severe left ventricular systolic dysfunction   Delirium due to another medical condition, acute, hyperactive   Discharged Condition: fair  Hospital Course: 41 year old man who was found unresponsive by mother. In PEA arrest for EMS who arrived 5-40min after being found. Received 15 min of ACLS prior to ROSC. In ED, found to have anterior ST elevation and underwent PCI to proximal LAD with DES with TIMI III flow reestablished. Patient initially had severe agitated delirium requiring Precedex post extubation. Eventually calmed down and was cooperative. Echo confirmed EF 25% and patient started on ASA, Brillinta and Lipitor, Toprol XL and Cozaar. Unfortunately, patient became agitated again and disinhibited requesting to leave AMA.  Consults: cardiology and pulmonary/intensive care  Significant Diagnostic Studies: cardiac graphics: Echocardiogram: EF 25% with anterior wall motion abnormality  Treatments: cardiac meds: metoprolol and ASA, Brillinta, Lipitor, Cozaar  Discharge Exam: Blood pressure (!) 141/86, pulse 76, temperature 99.8 F (37.7 C), temperature source Oral, resp. rate 14, height 5\' 9"  (1.753 m), weight 93.6 kg, SpO2 93 %. Patient left AMA  Disposition:   Discharge Instructions     Amb Referral to Cardiac Rehabilitation   Complete by: As directed    Diagnosis:  Coronary Stents STEMI PTCA     After initial evaluation and assessments  completed: Virtual Based Care may be provided alone or in conjunction with Phase 2 Cardiac Rehab based on patient barriers.: Yes      Allergies as of 04/15/2021   No Known Allergies      Medication List     ASK your doctor about these medications    HYDROcodone-acetaminophen 5-325 MG tablet Commonly known as: Norco Take 1-2 tablets by mouth every 6 (six) hours as needed.   omeprazole 20 MG capsule Commonly known as: PRILOSEC Take 20 mg by mouth daily.   penicillin v potassium 500 MG tablet Commonly known as: VEETID Take 1 tablet (500 mg total) by mouth 3 (three) times daily.       Patient has been contacted by HF Navigator RN and outpatient follow-up has been arranged.   Signed1/21/2023 04/19/2021, 9:41 AM

## 2021-04-19 NOTE — Progress Notes (Signed)
Heart and Vascular Care Navigation  04/19/2021  Joe Miranda March 11, 1981 [id number]  Reason for Referral: Patient seen in HF TOC.   Engaged with patient face to face for initial visit for Heart and Vascular Care Coordination.                                                                                                   Assessment:  Patient is a 41 yo male who suffered cardiac arrest  on 04-13-21 and left the hospital 04-15-21 AMA. Patient reports he lives with his girlfriend and her disabled adult son. He reports he has no insurance, no income and has a limited work history. He is currently in school to become a Paediatric nurse.  Patient reports he has food stamps and his mother will provide all transportation needs to any medical appointments.                               HRT/VAS Care Coordination     Patients Home Cardiology Office Heart Failure Clinic  HF [facility]   Outpatient Care Team Social Worker   Social Worker Name: Lasandra Beech, [place] [phone]   Living arrangements for the past 2 months Single Family Home   Lives with: Significant Other; Other (Comment)  69 yo step son   Home Assistive Devices/Equipment Scales       Social History:                                                                             SDOH Screenings   Alcohol Screen: Not on file  Depression (PHQ2-9): Not on file  Financial Resource Strain: High Risk   Difficulty of Paying Living Expenses: Hard  Food Insecurity: No Food Insecurity   Worried About Programme researcher, broadcasting/film/video in the Last Year: Never true   Ran Out of Food in the Last Year: Never true  Housing: Low Risk    Last Housing Risk Score: 0  Physical Activity: Not on file  Social Connections: Not on file  Stress: Not on file  Tobacco Use: High Risk   Smoking Tobacco Use: Every Day   Smokeless Tobacco Use: Former   Passive Exposure: Not on Chartered certified accountant Needs: No Transportation Needs   Lack of Transportation (Medical): No   Lack of  Transportation (Non-Medical): No    SDOH Interventions: Financial Resources:  Financial Strain Interventions: Other (Comment), Artist (Referal to [employer]) Financial Counseling for Medicaid screen  Food Insecurity:  Food Insecurity Interventions:  Engineer, maintenance (IT))  Housing Insecurity:  Housing Interventions: Intervention Not Indicated  Transportation:   Transportation Interventions: Intervention Not Indicated   Follow-up plan:  CSW discussed referral to [employer] for disability application. Patient refused Paramedicine Program as well as support service/resources  for substance use at this time. Patient reports he is determined to follow up with care for improved health. Patient verbalizes understanding of severity of illness and needs for follow up. CSW will be available to patient going forward to assist with resources as needed. Lasandra Beech, LCSW, CCSW-MCS [phone]

## 2021-04-20 ENCOUNTER — Other Ambulatory Visit (HOSPITAL_COMMUNITY): Payer: Self-pay

## 2021-04-20 NOTE — Progress Notes (Incomplete)
***In Progress*** Referring Physician: Dr Gwenlyn Found  Primary Care:None  Primary Cardiologist: Dr Tamala Julian       Advanced Heart Failure Clinic Note   HPI:  Referred to clinic by Dr Tamala Julian for heart failure consultation.    Joe Miranda is a 41 year old with a history of asthma, cocaine (quit 2 weeks ago) until recent admit. Recent admit had DES to proximal LAD and new HFrEF. Last used cocaine a week ago.     Prior to admit he had been having chest pain and had an ED visit in the fall but left before he was seen.    Admitted 04/13/21 after he was found unresponsive by his girlfriend. Had OOH PEA arrest.  CPR 20 min with ROSC. EKG with ST elevated V2-V4. Had emergency cath and underwent PCI /DES to proximal  LAD. Self extubated on 04/14/21.  Echo with severely reduced EF < 25%. Had frequent PVCs on the monitor. On 1/18 staff reported agitation but improved after haldol and ativan. On 04/15/21 he left AMA without any medications.    Presented to HF [facility] clinic on 04/20/21 with his mother. He had only been taking tylenol and aspirin. Overall was feeling ok but having chest soreness when he coughs. Denied SOB/PND/Orthopnea. Appetite was ok. No fever or chills. Reported smoking 2-3 cigarettes per day. Had not used cocaine since discharge. He reported that he does not have a scale at home. 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 Reported smoking a few cigarettes. Lives with his girlfriend and 55 year old stepson.He does not have medical insurance.    Today he returns to HF clinic for pharmacist medication titration. At last visit with APP, Entresto 24/26 mg BID and spironolactone 12.5 mg daily were initiated. He was referred to [facility].    Shortness of breath/dyspnea on exertion? {YES NO:22349}  Orthopnea/PND? {YES NO:22349} Edema? {YES NO:22349} Lightheadedness/dizziness? {YES NO:22349} Daily weights at home? {YES NO:22349} Blood pressure/heart rate monitoring at home? {YES  V2345720 Following low-sodium/fluid-restricted diet? {YES NO:22349}  HF Medications: Entresto 24/26 mg BID Spironolactone 12.5 mg daily  Has the patient been experiencing any side effects to the medications prescribed?  {YES NO:22349}  Does the patient have any problems obtaining medications due to transportation or finances?   {YES NO:22349}  Understanding of regimen: {excellent/good/fair/poor:19665} Understanding of indications: {excellent/good/fair/poor:19665} Potential of compliance: {excellent/good/fair/poor:19665} Patient understands to avoid NSAIDs. Patient understands to avoid decongestants.    Pertinent Lab Values: Serum creatinine ***, BUN ***, Potassium ***, Sodium ***, BNP ***, Magnesium ***, Digoxin ***   Vital Signs: Weight: *** (last clinic weight: ***) Blood pressure: ***  Heart rate: ***   Assessment/Plan: Chronic HFrEF -Echo EF 20-25% RV normal. LHC with DES to LAD.  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  -NYHA II. Volume status stable.  - no beta blocker with bradycardia and recent cocaine use - Continue entresto 24-26 mg BID. - Continue spironolactone 12.5 mg at bed time.  - Consider SLT2i next visit.  -He is adamant he does not want to wear a life vest. We discussed at length.  - Plan to repeat ECHO in 3 months after HF meds optimized.     2. CAD  04/14/21 --->S/P PCI DES to prox LAD. Of note he left AMA on 04/15/21 and did not have medications.  - Continue atorvastatin 80 mg daily - Continue Brillinta 90 mg BID - Continue ASA 81 mg daily  - No chest pain.  3. Tobacco  - Discussed cessation    4. Cocaine Abuse -Discussed cessation. Last uses earlier this month. His mother is not aware of his recent cocaine use.   Follow up ***   Audry Riles, PharmD, BCPS, BCCP, CPP Heart Failure Clinic Pharmacist [phone]

## 2021-04-27 ENCOUNTER — Other Ambulatory Visit (HOSPITAL_COMMUNITY): Payer: 59

## 2021-04-27 ENCOUNTER — Inpatient Hospital Stay (HOSPITAL_COMMUNITY)
Admission: RE | Admit: 2021-04-27 | Discharge: 2021-04-27 | Disposition: A | Discharge status: Home / Self Care | Payer: 59 | Source: Ambulatory Visit

## 2021-04-28 ENCOUNTER — Other Ambulatory Visit (HOSPITAL_COMMUNITY): Payer: Self-pay

## 2021-04-28 ENCOUNTER — Telehealth (HOSPITAL_COMMUNITY): Payer: Self-pay

## 2021-04-28 NOTE — Telephone Encounter (Signed)
Transitions of Care Pharmacy   Call attempted for a pharmacy transitions of care follow-up. Patient unavailable at this time.  Call attempt #1. Will follow-up in 2-3 days.    

## 2021-04-29 ENCOUNTER — Telehealth (HOSPITAL_COMMUNITY): Payer: Self-pay

## 2021-04-29 NOTE — Telephone Encounter (Signed)
Transitions of Care Pharmacy   Call attempted for a pharmacy transitions of care follow-up. HIPAA appropriate voicemail was left with call back information provided.   Call attempt #2. Will follow-up in 2-3 days.    

## 2021-05-04 ENCOUNTER — Other Ambulatory Visit (HOSPITAL_COMMUNITY): Payer: Self-pay

## 2021-05-04 ENCOUNTER — Telehealth (HOSPITAL_COMMUNITY): Payer: Self-pay | Admitting: Pharmacist

## 2021-05-04 ENCOUNTER — Other Ambulatory Visit: Payer: Self-pay

## 2021-05-04 ENCOUNTER — Ambulatory Visit (HOSPITAL_COMMUNITY)
Admission: EM | Admit: 2021-05-04 | Discharge: 2021-05-04 | Payer: 59 | Attending: Family Medicine | Admitting: Family Medicine

## 2021-05-04 DIAGNOSIS — R0602 Shortness of breath: Secondary | ICD-10-CM | POA: Diagnosis not present

## 2021-05-04 DIAGNOSIS — R079 Chest pain, unspecified: Secondary | ICD-10-CM | POA: Diagnosis not present

## 2021-05-04 DIAGNOSIS — R051 Acute cough: Secondary | ICD-10-CM

## 2021-05-04 NOTE — ED Notes (Signed)
Pt states coughing causes pain

## 2021-05-04 NOTE — ED Triage Notes (Signed)
Pt reports having a heart attack 1 month ago and states his chest has been hurting since.   States he has been coughing up phlegm and fluid x 5 days.   States he has a pain when breathing in on the right side. States he cannot breathe at night.

## 2021-05-04 NOTE — ED Notes (Signed)
Patient is being discharged from the Urgent Care and sent to the Emergency Department via personal vehicle . Per Dr Tracie Harrier, patient is in need of higher level of care due to needing cardiac workup. Patient is aware and verbalizes understanding of plan of care.  Vitals:   05/04/21 2006  BP: 129/80  Pulse: 82  Resp: 15  Temp: 98 F (36.7 C)  SpO2: 100%

## 2021-05-04 NOTE — Telephone Encounter (Signed)
Transitions of Care Pharmacy   Call attempted for a pharmacy transitions of care follow-up. HIPAA appropriate message was left with patient's mother who took contact information and will have patient call back.  Call attempt #3.

## 2021-05-04 NOTE — Telephone Encounter (Signed)
Pharmacy Transitions of Care Follow-up Telephone Call  Date of discharge: 04/15/2021  Discharge Diagnosis: STEMI  How have you been since you were released from the hospital? Overall well   Medication changes made at discharge:  - START: Ticagrelor 90 mg BID  Medication changes verified by the patient? Yes    Medication Accessibility:  Home Pharmacy: [facility]   Was the patient provided with refills on discharged medications? Yes   Have all prescriptions been transferred from [facility] to home pharmacy? No, pt may be using [facility] for refills   Is the patient able to afford medications? Patient does not have insurance, applying for medicaid, also eligible for HF fund as a bridge while waiting on Medicaid.   Medication Review: TICAGRELOR (BRILINTA) Ticagrelor 90 mg BID initiated on 04/15/2021.  - Educated patient on expected duration of therapy of aspirin with ticagrelor. - Discussed importance of taking medication around the same time every day, - Advised patient of medications to avoid (NSAIDs, aspirin maintenance doses>100 mg daily) - Educated that Tylenol (acetaminophen) will be the preferred analgesic to prevent risk of bleeding  - Emphasized importance of monitoring for signs and symptoms of bleeding (abnormal bruising, prolonged bleeding, nose bleeds, bleeding from gums, discolored urine, black tarry stools)  - Educated patient to notify doctor if shortness of breath or abnormal heartbeat occur - Advised patient to alert all providers of antiplatelet therapy prior to starting a new medication or having a procedure    Follow-up Appointments:  PCP Hospital f/u appt confirmed? No PCP at this time   [facility] f/u appt confirmed? Seen by Cardiology on 04/19/2021. Scheduled to see Heart and Vasc clinic on 05/10/21.   If their condition worsens, is the pt aware to call PCP or go to the Emergency Dept.? Yes  Final Patient Assessment: Pt  reports compliance and demonstrates understanding of new medications.  Is in need of financial assistance and will discuss this at upcoming appointment.

## 2021-05-05 NOTE — ED Provider Notes (Signed)
[facility]   [id number] 05/04/21 Arrival Time: 1941  ASSESSMENT & PLAN:  1. Chest pain, unspecified type   2. SOB (shortness of breath)   3. Acute cough    With STEMI approx 3 w ago. Left ICU AMA. Appears to have scheduled f/u. Reports continued chest pressure. Now coughing. Given recent history, I am limited as to the workup I can perform. Recommend ED evaluation. He agrees. D/C to ED via private vehicle.  ECG: Performed today and interpreted by me: sinus; no STEMI.  Stable VS. Stable upon d/c.  Reviewed expectations re: course of current medical issues. Questions answered. Outlined signs and symptoms indicating need for more acute intervention. Patient verbalized understanding. After Visit Summary given.   SUBJECTIVE:  History from: patient. Joe Miranda is a 41 y.o. male who had STEMI and cardiac arrest 3 w ago. Left ICU AMA. Now presents here with chest soreness/discomfort and coughing. "Feel like I have fluid in my lungs". Afebrile. Coughing up phlegm. SOB, esp at night. Chest discomfort 1/10 here.   Social History   Tobacco Use  Smoking Status Every Day   Packs/day: 1.00   Types: Cigarettes  Smokeless Tobacco Former   Social History   Substance and Sexual Activity  Alcohol Use Not Currently    OBJECTIVE:  Vitals:   05/04/21 2006  BP: 129/80  Pulse: 82  Resp: 15  Temp: 98 F (36.7 C)  TempSrc: Oral  SpO2: 100%    General appearance: alert, oriented, no acute distress Eyes: PERRLA; EOMI; conjunctivae normal HENT: normocephalic; atraumatic Neck: supple with FROM Lungs: without labored respirations; speaks full sentences without difficulty; CTAB Heart: regular  Chest Wall: without tenderness to palpation Abdomen: soft, non-tender; no guarding or rebound tenderness Extremities: without edema; without calf swelling or tenderness; symmetrical without gross deformities Skin: warm and dry; without rash or lesions Neuro: normal  gait Psychological: alert and cooperative; normal mood and affect         No Known Allergies  Past Medical History:  Diagnosis Date   Asthma    Heroin abuse (HCC)    Social History   Socioeconomic History   Marital status: Single    Spouse name: Not on file   Number of children: Not on file   Years of education: Not on file   Highest education level: GED or equivalent  Occupational History   Not on file  Tobacco Use   Smoking status: Every Day    Packs/day: 1.00    Types: Cigarettes   Smokeless tobacco: Former  Substance and Sexual Activity   Alcohol use: Not Currently   Drug use: Not Currently    Types: Marijuana, Cocaine   Sexual activity: Yes  Other Topics Concern   Not on file  Social History Narrative   Not on file   Social Determinants of Health   Financial Resource Strain: High Risk   Difficulty of Paying Living Expenses: Hard  Food Insecurity: No Food Insecurity   Worried About Running Out of Food in the Last Year: Never true   Ran Out of Food in the Last Year: Never true  Transportation Needs: No Transportation Needs   Lack of Transportation (Medical): No   Lack of Transportation (Non-Medical): No  Physical Activity: Not on file  Stress: Not on file  Social Connections: Not on file  Intimate Partner Violence: Not on file   Family History  Problem Relation Age of Onset   CVA Mother    Hypercholesterolemia Mother  Past Surgical History:  Procedure Laterality Date   CORONARY/GRAFT ACUTE MI REVASCULARIZATION N/A 04/13/2021   Procedure: Coronary/Graft Acute MI Revascularization;  Surgeon: Lyn Records, MD;  Location: [facility] INVASIVE CV LAB;  Service: Cardiovascular;  Laterality: N/A;   HAND SURGERY     LEFT HEART CATH AND CORONARY ANGIOGRAPHY N/A 04/13/2021   Procedure: LEFT HEART CATH AND CORONARY ANGIOGRAPHY;  Surgeon: Lyn Records, MD;  Location: [facility] INVASIVE CV LAB;  Service: Cardiovascular;  Laterality: N/A;      Mardella Layman, MD 05/05/21  1011

## 2021-05-07 ENCOUNTER — Other Ambulatory Visit (HOSPITAL_BASED_OUTPATIENT_CLINIC_OR_DEPARTMENT_OTHER): Payer: Self-pay

## 2021-05-10 ENCOUNTER — Other Ambulatory Visit: Payer: Self-pay

## 2021-05-10 ENCOUNTER — Encounter (HOSPITAL_COMMUNITY): Payer: Self-pay

## 2021-05-10 ENCOUNTER — Ambulatory Visit (HOSPITAL_COMMUNITY)
Admission: RE | Admit: 2021-05-10 | Discharge: 2021-05-10 | Disposition: A | Discharge status: Home / Self Care | Payer: 59 | Source: Ambulatory Visit | Attending: Family Medicine | Admitting: Family Medicine

## 2021-05-10 ENCOUNTER — Other Ambulatory Visit (HOSPITAL_COMMUNITY): Payer: Self-pay

## 2021-05-10 VITALS — BP 128/80 | HR 78 | Wt 206.8 lb

## 2021-05-10 DIAGNOSIS — I5022 Chronic systolic (congestive) heart failure: Secondary | ICD-10-CM | POA: Diagnosis not present

## 2021-05-10 DIAGNOSIS — F17210 Nicotine dependence, cigarettes, uncomplicated: Secondary | ICD-10-CM | POA: Diagnosis not present

## 2021-05-10 DIAGNOSIS — Z7902 Long term (current) use of antithrombotics/antiplatelets: Secondary | ICD-10-CM | POA: Insufficient documentation

## 2021-05-10 DIAGNOSIS — R7303 Prediabetes: Secondary | ICD-10-CM | POA: Diagnosis not present

## 2021-05-10 DIAGNOSIS — F1410 Cocaine abuse, uncomplicated: Secondary | ICD-10-CM

## 2021-05-10 DIAGNOSIS — F1411 Cocaine abuse, in remission: Secondary | ICD-10-CM | POA: Diagnosis not present

## 2021-05-10 DIAGNOSIS — I493 Ventricular premature depolarization: Secondary | ICD-10-CM | POA: Insufficient documentation

## 2021-05-10 DIAGNOSIS — Z955 Presence of coronary angioplasty implant and graft: Secondary | ICD-10-CM | POA: Insufficient documentation

## 2021-05-10 DIAGNOSIS — Z7982 Long term (current) use of aspirin: Secondary | ICD-10-CM | POA: Diagnosis not present

## 2021-05-10 DIAGNOSIS — Z79899 Other long term (current) drug therapy: Secondary | ICD-10-CM | POA: Insufficient documentation

## 2021-05-10 DIAGNOSIS — Z9861 Coronary angioplasty status: Secondary | ICD-10-CM

## 2021-05-10 DIAGNOSIS — I2510 Atherosclerotic heart disease of native coronary artery without angina pectoris: Secondary | ICD-10-CM | POA: Diagnosis not present

## 2021-05-10 DIAGNOSIS — J45909 Unspecified asthma, uncomplicated: Secondary | ICD-10-CM | POA: Insufficient documentation

## 2021-05-10 DIAGNOSIS — Z720 Tobacco use: Secondary | ICD-10-CM

## 2021-05-10 DIAGNOSIS — Z8674 Personal history of sudden cardiac arrest: Secondary | ICD-10-CM | POA: Insufficient documentation

## 2021-05-10 LAB — BASIC METABOLIC PANEL
Anion gap: 9 (ref 5–15)
BUN: 12 mg/dL (ref 6–20)
CO2: 24 mmol/L (ref 22–32)
Calcium: 9 mg/dL (ref 8.9–10.3)
Chloride: 107 mmol/L (ref 98–111)
Creatinine, Ser: 0.94 mg/dL (ref 0.61–1.24)
GFR, Estimated: >60 mL/min (ref >60)
Glucose, Bld: 89 mg/dL (ref 70–99)
Potassium: 4.3 mmol/L (ref 3.5–5.1)
Sodium: 140 mmol/L (ref 135–145)

## 2021-05-10 MED ORDER — DAPAGLIFLOZIN PROPANEDIOL 10 MG PO TABS
10.0000 mg | ORAL_TABLET | Freq: Every day | ORAL | 3 refills | Status: DC
  Filled 2021-05-10: qty 30, 30d supply, fill #0
  Filled 2021-06-04: qty 30, 30d supply, fill #1

## 2021-05-10 NOTE — Progress Notes (Addendum)
ADVANCED HF CLINIC CONSULT NOTE  Referring Provider: Darrick Grinder, NP Primary Care: none Primary Cardiologist: Dr. Tamala Julian HF Cardiologist: Dr. Haroldine Laws  HPI:   Joe Miranda s a 41 y.o. with a history of asthma, cocaine use. Recent admit had DES to proximal LAD and new HFrEF.   Prior to admit he had been having chest pain and had and ED visit in the fall but left before he was seen.    Admitted 04/13/21 after he was found unresponsive by his girlfriend. Had OOH PEA arrest.  CPR 20 min with ROSC. EKG with ST elevated V2-V4. Had emergency cath and underwent PCI /DES to proximal  LAD. Self extubated on 04/14/21.  Echo with severely reduced EF < 25%. Had frequent PVCs on the monitor. On 1/18 staff reported agitation but improved after haldol and ativan. On 04/15/21 he left AMA without any medications.    Follow up in [facility] 1/23 had been off all meds except ASA. Smoking 2-3 cigs/day, no further cocaine use. GDMT restarted.  Lives with his girlfriend and 30 year old stepson.He has not medical insurance.  Seen in UC 05/04/21 for chest pain and SOB. Advised to go to ED but he did not go.   Today he present to AHF clinic to establish care, with his mother. Breathing is better, but he is SOB with walking further distances on flat ground. He is fatigued with activity.  He has positional dizziness with standing, but no syncope. Denies abnormal bleeding, palpations, CP, edema, or PND/Orthopnea. Appetite ok. No fever or chills. He does not weigh at home, has scale. Taking all medications now, off HF meds for a bit but been back on them for past 2 weeks. Last used cocaine 04/13/21, no tobacco, [employer] information [employer] school. Lives with his girlfriend and 76  year old stepson.   Cardiac Testing  Echo 04/14/21  EF 20-25%. RV normal. Grade I DD.    LHC 04/13/21 -LAD 99% proximal  PCI /DES . All other vessels ok.     Review of Systems: [y] = yes, [ ]  = no   General: Weight gain [ ] ; Weight loss [ ] ; Anorexia [ ] ; Fatigue Joe Miranda ];  Fever [ ] ; Chills [ ] ; Weakness Joe Miranda ]  Cardiac: Chest pain/pressure [ ] ; Resting SOB [ ] ; Exertional SOB Joe Miranda ]; Orthopnea [ ] ; Pedal Edema [ ] ; Palpitations [ ] ; Syncope [ ] ; Presyncope [ ] ; Paroxysmal nocturnal dyspnea[ ]   Pulmonary: Cough [ ] ; Wheezing[ ] ; Hemoptysis[ ] ; Sputum [ ] ; Snoring [ ]   GI: Vomiting[ ] ; Dysphagia[ ] ; Melena[ ] ; Hematochezia [ ] ; Heartburn[ ] ; Abdominal pain [ ] ; Constipation [ ] ; Diarrhea [ ] ; BRBPR [ ]   GU: Hematuria[ ] ; Dysuria [ ] ; Nocturia[ ]   Vascular: Pain in legs with walking [ ] ; Pain in feet with lying flat [ ] ; Non-healing sores [ ] ; Stroke [ ] ; TIA [ ] ; Slurred speech [ ] ;  Neuro: Headaches[ ] ; Vertigo[ ] ; Seizures[ ] ; Paresthesias[ ] ;Blurred vision [ ] ; Diplopia [ ] ; Vision changes [ ]   Ortho/Skin: Arthritis [ ] ; Joint pain [ ] ; Muscle pain [ ] ; Joint swelling [ ] ; Back Pain [ ] ; Rash [ ]   Psych: Depression[ ] ; Anxiety[ ]   Heme: Bleeding problems [ ] ; Clotting disorders [ ] ; Anemia [ ]   Endocrine: Diabetes [ ] ; Thyroid dysfunction[ ]   Past Medical History:  Diagnosis Date   Asthma    Heroin abuse ([place])    Current Outpatient Medications  Medication Sig Dispense Refill   acetaminophen (  TYLENOL) 325 MG tablet Take 650 mg by mouth every 4 (four) hours as needed.     aspirin EC 81 MG tablet Take 81 mg by mouth daily. Swallow whole.     atorvastatin (LIPITOR) 80 MG tablet Take 1 tablet (80 mg total) by mouth daily. 30 tablet 11   sacubitril-valsartan (ENTRESTO) 24-26 MG Take 1 tablet by mouth 2 (two) times daily. 60 tablet 6   spironolactone (ALDACTONE) 25 MG tablet Take 1/2 tablet (12.5 mg total) by mouth at bedtime. 30 tablet 11   ticagrelor (BRILINTA) 90 MG TABS tablet Take 1 tablet (90 mg total) by mouth 2 (two) times daily. 60 tablet 6   No current facility-administered medications for this encounter.   No Known Allergies  Social History   Socioeconomic History   Marital status: Single    Spouse name: Not on file   Number of children: Not on  file   Years of education: Not on file   Highest education level: GED or equivalent  Occupational History   Not on file  Tobacco Use   Smoking status: Every Day    Packs/day: 1.00    Types: Cigarettes   Smokeless tobacco: Former  Substance and Sexual Activity   Alcohol use: Not Currently   Drug use: Not Currently    Types: Marijuana, Cocaine   Sexual activity: Yes  Other Topics Concern   Not on file  Social History Narrative   Not on file   Social Determinants of Health   Financial Resource Strain: High Risk   Difficulty of Paying Living Expenses: Hard  Food Insecurity: No Food Insecurity   Worried About Running Out of Food in the Last Year: Never true   Ran Out of Food in the Last Year: Never true  Transportation Needs: No Transportation Needs   Lack of Transportation (Medical): No   Lack of Transportation (Non-Medical): No  Physical Activity: Not on file  Stress: Not on file  Social Connections: Not on file  Intimate Partner Violence: Not on file   Family History  Problem Relation Age of Onset   CVA Mother    Hypercholesterolemia Mother    BP 128/80    Pulse 78    Wt 93.8 kg (206 lb 12.8 oz)    SpO2 97%    BMI 30.54 kg/m   Wt Readings from Last 3 Encounters:  05/10/21 93.8 kg (206 lb 12.8 oz)  04/19/21 96.8 kg (213 lb 6.4 oz)  04/15/21 93.6 kg (206 lb 5.6 oz)   PHYSICAL EXAM: General:  NAD. No resp difficulty HEENT: Normal Neck: Supple. No JVD. Carotids 2+ bilat; no bruits. No lymphadenopathy or thryomegaly appreciated. Cor: PMI nondisplaced. Regular rate & rhythm. No rubs, gallops or murmurs. Lungs: Clear Abdomen: Soft, nontender, nondistended. No hepatosplenomegaly. No bruits or masses. Good bowel sounds. Extremities: No cyanosis, clubbing, rash, edema Neuro: Alert & oriented x 3, cranial nerves grossly intact. Moves all 4 extremities w/o difficulty. Affect pleasant.  ASSESSMENT & PLAN: Chronic HFrEF - Echo EF 20-25% RV normal. LHC with DES to LAD.  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  - NYHA II. Volume status stable.  - No bb with recent bradycardia and cocaine use - Start Farxiga 10 mg daily. - Continue Entresto 24-26 mg bid. - Continue spironolactone 12.5 mg at bed time.  - He is adamant he does not want to wear a life vest, discussed at length previous visit.  - Plan to repeat ECHO in 3 months after HF meds optimized.  -  BMET today, repeat 10-14 days.   2. CAD  04/14/21 --->S/P PCI DES to prox LAD. Of note he left AMA on 04/15/21 and did not have medications.  - Continue atorvastatin 80 mg daily. - Continue Brillinta 90 mg bid. - Continue ASA 81 mg daily.  - No chest pain.    3. Tobacco Use - Discussed cessation.    4. Cocaine Abuse - Discussed cessation. Last used 3 weeks ago.  5. Pre-Diabetes - A1C 5.7 (2/23). - Start [place].    I suggested HF Paramedicine however he was not interested.  Follow up with APP in 6 weeks (increase spiro or Entresto)  and in 12 weeks with Dr. Haroldine Laws + echo. He was given a note today for his school absences since his discharge.  Allena Katz, FNP-BC 05/10/21

## 2021-05-10 NOTE — Patient Instructions (Signed)
Thank you for coming in today  Labs were done today, if any labs are abnormal the clinic will call you  START Farxiga 10 mg 1 tablet daily  Your physician recommends that you return for lab work in: 10 days  Your physician recommends that you schedule a follow-up appointment in:  6 weeks in clinic  3 months with Dr. Gala Romney with echocardiogram  Your physician has requested that you have an echocardiogram. Echocardiography is a painless test that uses sound waves to create images of your heart. It provides your doctor with information about the size and shape of your heart and how well your hearts chambers and valves are working. This procedure takes approximately one hour. There are no restrictions for this procedure.   At the [facility], you and your health needs are our priority. As part of our continuing mission to provide you with exceptional heart care, we have created designated Provider Care Teams. These Care Teams include your primary Cardiologist (physician) and Advanced Practice Providers (APPs- Physician Assistants and Nurse Practitioners) who all work together to provide you with the care you need, when you need it.   You may see any of the following providers on your designated Care Team at your next follow up: Dr Arvilla Meres Dr Carron Curie, NP Robbie Lis, [place] [facility] [place], [place] Karle Plumber, PharmD   Please be sure to bring in all your medications bottles to every appointment.   If you have any questions or concerns before your next appointment please send [place] a message through [place] or call our office at [phone].    TO LEAVE A MESSAGE FOR THE NURSE SELECT OPTION 2, PLEASE LEAVE A MESSAGE INCLUDING: YOUR NAME DATE OF BIRTH CALL BACK NUMBER REASON FOR CALL**this is important as we prioritize the call backs  YOU WILL RECEIVE A CALL BACK THE SAME DAY AS LONG AS YOU CALL BEFORE 4:00 PM

## 2021-05-20 ENCOUNTER — Ambulatory Visit (HOSPITAL_COMMUNITY)
Admission: RE | Admit: 2021-05-20 | Discharge: 2021-05-20 | Disposition: A | Discharge status: Home / Self Care | Payer: 59 | Source: Ambulatory Visit | Attending: Internal Medicine | Admitting: Internal Medicine

## 2021-05-20 ENCOUNTER — Other Ambulatory Visit: Payer: Self-pay

## 2021-05-20 DIAGNOSIS — I5022 Chronic systolic (congestive) heart failure: Secondary | ICD-10-CM | POA: Diagnosis present

## 2021-05-20 LAB — BASIC METABOLIC PANEL
Anion gap: 9 (ref 5–15)
BUN: 9 mg/dL (ref 6–20)
CO2: 22 mmol/L (ref 22–32)
Calcium: 8.9 mg/dL (ref 8.9–10.3)
Chloride: 108 mmol/L (ref 98–111)
Creatinine, Ser: 0.99 mg/dL (ref 0.61–1.24)
GFR, Estimated: >60 mL/min (ref >60)
Glucose, Bld: 85 mg/dL (ref 70–99)
Potassium: 4.2 mmol/L (ref 3.5–5.1)
Sodium: 139 mmol/L (ref 135–145)

## 2021-06-04 ENCOUNTER — Other Ambulatory Visit (HOSPITAL_COMMUNITY): Payer: Self-pay

## 2021-06-07 ENCOUNTER — Telehealth (HOSPITAL_COMMUNITY): Payer: Self-pay | Admitting: *Deleted

## 2021-06-07 ENCOUNTER — Other Ambulatory Visit (HOSPITAL_COMMUNITY): Payer: Self-pay

## 2021-06-07 ENCOUNTER — Telehealth (HOSPITAL_COMMUNITY): Payer: Self-pay | Admitting: Pharmacy Technician

## 2021-06-07 ENCOUNTER — Other Ambulatory Visit (HOSPITAL_COMMUNITY): Payer: Self-pay | Admitting: *Deleted

## 2021-06-07 MED ORDER — TICAGRELOR 90 MG PO TABS
90.0000 mg | ORAL_TABLET | Freq: Two times a day (BID) | ORAL | 6 refills | Status: DC
Start: 2021-06-07 — End: 2021-06-22
  Filled 2021-06-07: qty 60, 30d supply, fill #0

## 2021-06-07 NOTE — Telephone Encounter (Signed)
Patients mother stopped by the clinic to get samples of Brilinta because it was $400 at the outpatient pharmacy and $200 at [facility]. She asked if there was anyway to get the medication cheaper. I told her I would send a message to [facility] and then follow up with her.  ?

## 2021-06-07 NOTE — Telephone Encounter (Signed)
Advanced Heart Failure Patient Advocate Encounter ? ?Spoke with patient's mother. Started applications for both [employer] assistance. The patient currently has no income. Advised his mother that he will have to call [employer] if he does not have tax return from last year to request a income attestation form to be mailed to him. He will then have to fill that in, sign and return the form. Patient will likely be denied for Brilinta assistance until he has applied for and been denied Medicaid. The patient currently has Brilinta samples. Will also get Entresto samples while we get applications sent in. ? ?Sent applications to patient's mom via docusign.  ?

## 2021-06-08 NOTE — Telephone Encounter (Signed)
Medication Samples have been provided to the patient. ? ?Drug name: Entresto       Strength: 24-26mg         Qty: 2 bottles  LOT: [id number]  Exp.Date: 02/25 ? ?Dosing instructions: Take 1 tablet twice daily  ? ?The patient has been instructed regarding the correct time, dose, and frequency of taking this medication, including desired effects and most common side effects.  ? ?Charlann Boxer ?11:56 AM ?06/08/2021 ? ?

## 2021-06-14 ENCOUNTER — Encounter (HOSPITAL_COMMUNITY): Payer: Self-pay

## 2021-06-14 ENCOUNTER — Other Ambulatory Visit: Payer: Self-pay

## 2021-06-14 ENCOUNTER — Ambulatory Visit (HOSPITAL_COMMUNITY)
Admission: RE | Admit: 2021-06-14 | Discharge: 2021-06-14 | Disposition: A | Discharge status: Home / Self Care | Payer: 59 | Source: Ambulatory Visit | Attending: Family Medicine | Admitting: Family Medicine

## 2021-06-14 VITALS — BP 120/80 | HR 77 | Wt 204.6 lb

## 2021-06-14 DIAGNOSIS — R7303 Prediabetes: Secondary | ICD-10-CM | POA: Diagnosis not present

## 2021-06-14 DIAGNOSIS — Z79899 Other long term (current) drug therapy: Secondary | ICD-10-CM | POA: Diagnosis not present

## 2021-06-14 DIAGNOSIS — Z7982 Long term (current) use of aspirin: Secondary | ICD-10-CM | POA: Diagnosis not present

## 2021-06-14 DIAGNOSIS — Z9861 Coronary angioplasty status: Secondary | ICD-10-CM

## 2021-06-14 DIAGNOSIS — Z955 Presence of coronary angioplasty implant and graft: Secondary | ICD-10-CM | POA: Diagnosis not present

## 2021-06-14 DIAGNOSIS — F1410 Cocaine abuse, uncomplicated: Secondary | ICD-10-CM | POA: Diagnosis not present

## 2021-06-14 DIAGNOSIS — Z720 Tobacco use: Secondary | ICD-10-CM | POA: Diagnosis not present

## 2021-06-14 DIAGNOSIS — I2510 Atherosclerotic heart disease of native coronary artery without angina pectoris: Secondary | ICD-10-CM | POA: Insufficient documentation

## 2021-06-14 DIAGNOSIS — Z7902 Long term (current) use of antithrombotics/antiplatelets: Secondary | ICD-10-CM | POA: Diagnosis not present

## 2021-06-14 DIAGNOSIS — Z8674 Personal history of sudden cardiac arrest: Secondary | ICD-10-CM | POA: Insufficient documentation

## 2021-06-14 DIAGNOSIS — I5022 Chronic systolic (congestive) heart failure: Secondary | ICD-10-CM | POA: Diagnosis present

## 2021-06-14 DIAGNOSIS — Z7984 Long term (current) use of oral hypoglycemic drugs: Secondary | ICD-10-CM | POA: Insufficient documentation

## 2021-06-14 NOTE — Patient Instructions (Signed)
Thank you for coming in today ? ?No Labs today ? ?No medication changes ? ?Your physician recommends that you schedule a follow-up appointment in:  ?Keep follow up appointment with Dr. Gala Romney ? ?At the [facility], you and your health needs are our priority. As part of our continuing mission to provide you with exceptional heart care, we have created designated Provider Care Teams. These Care Teams include your primary Cardiologist (physician) and Advanced Practice Providers (APPs- Physician Assistants and Nurse Practitioners) who all work together to provide you with the care you need, when you need it.  ? ?You may see any of the following providers on your designated Care Team at your next follow up: ?Dr Arvilla Meres ?Dr Marca Ancona ?Tonye Becket, NP ?Robbie Lis, PA ?Jessica Milford,NP ?Anna Genre, PA ?Karle Plumber, PharmD ? ? ?Please be sure to bring in all your medications bottles to every appointment.  ? ?If you have any questions or concerns before your next appointment please send [place] a message through [place] or call our office at [phone].   ? ?TO LEAVE A MESSAGE FOR THE NURSE SELECT OPTION 2, PLEASE LEAVE A MESSAGE INCLUDING: ?YOUR NAME ?DATE OF BIRTH ?CALL BACK NUMBER ?REASON FOR CALL**this is important as we prioritize the call backs ? ?YOU WILL RECEIVE A CALL BACK THE SAME DAY AS LONG AS YOU CALL BEFORE 4:00 PM ? ?

## 2021-06-14 NOTE — Progress Notes (Signed)
? ?ADVANCED HF CLINIC NOTE ? ?Primary Care: none ?Primary Cardiologist: Dr. Katrinka Blazing ?HF Cardiologist: Dr. Gala Romney ? ?HPI: ?Joe Miranda s a 41 y.o. with a history of asthma, cocaine use. Recent admit had DES to proximal LAD and new HFrEF.   ?Prior to admit he had been having chest pain and had and ED visit in the fall but left before he was seen.  ?  ?Admitted 04/13/21 after he was found unresponsive by his girlfriend. Had OOH PEA arrest.  CPR 20 min with ROSC. EKG with ST elevated V2-V4. Had emergency cath and underwent PCI /DES to proximal  LAD. Self extubated on 04/14/21.  Echo with severely reduced EF < 25%. Had frequent PVCs on the monitor. On 1/18 staff reported agitation but improved after haldol and ativan. On 04/15/21 he left AMA without any medications.  ?  ?Follow up in [facility] 1/23 had been off all meds except ASA. Smoking 2-3 cigs/day, no further cocaine use. GDMT restarted.  Lives with his girlfriend and 70 year old stepson.He has not medical insurance. ? ?Seen in UC 05/04/21 for chest pain and SOB. Advised to go to ED but he did not go.  ? ?Today he present to AHF clinic to establish care, with his mother. Breathing is better, but he is SOB with walking further distances on flat ground. He is fatigued with activity.  He has positional dizziness with standing, but no syncope. Denies abnormal bleeding, palpations, CP, edema, or PND/Orthopnea. Appetite ok. No fever or chills. He does not weigh at home, has scale. Taking all medications now, off HF meds for a bit but been back on them for past 2 weeks.  ? ?Today he returns for HF follow up with his mother. He gets SOB walking 1/2 mile. Overall feeling fine. Denies increasing SOB, CP, dizziness, edema, or PND/Orthopnea. Appetite ok. No fever or chills. Weight at home 202 pounds. Taking all medications. Last used cocaine 04/13/21, no tobacco, [employer] blower/designer school. Lives with his girlfriend and 64 year old stepson. Smoking 5 cigs/day, no ETOH/cocaine. ? ?  ?Cardiac  Testing  ?- Echo 04/14/21  EF 20-25%. RV normal. Grade I DD.  ?  ?- LHC 04/13/21 -LAD 99% proximal  PCI /DES . All other vessels ok.    ? ?ROS: All systems reviewed and negative except as per HPI.  ? ?Past Medical History:  ?Diagnosis Date  ? Asthma   ? Heroin abuse (HCC)   ? ?Current Outpatient Medications  ?Medication Sig Dispense Refill  ? acetaminophen (TYLENOL) 325 MG tablet Take 650 mg by mouth every 4 (four) hours as needed.    ? aspirin EC 81 MG tablet Take 81 mg by mouth daily. Swallow whole.    ? atorvastatin (LIPITOR) 80 MG tablet Take 1 tablet (80 mg total) by mouth daily. 30 tablet 11  ? dapagliflozin propanediol (FARXIGA) 10 MG TABS tablet Take 1 tablet (10 mg total) by mouth daily before breakfast. 30 tablet 3  ? sacubitril-valsartan (ENTRESTO) 24-26 MG Take 1 tablet by mouth 2 (two) times daily. 60 tablet 6  ? spironolactone (ALDACTONE) 25 MG tablet Take 1/2 tablet (12.5 mg total) by mouth at bedtime. 30 tablet 11  ? ticagrelor (BRILINTA) 90 MG TABS tablet Take 1 tablet (90 mg total) by mouth 2 (two) times daily. 60 tablet 6  ? ?No current facility-administered medications for this encounter.  ? ?No Known Allergies ? ?Social History  ? ?Socioeconomic History  ? Marital status: Single  ?  Spouse name: Not  on file  ? Number of children: Not on file  ? Years of education: Not on file  ? Highest education level: GED or equivalent  ?Occupational History  ? Not on file  ?Tobacco Use  ? Smoking status: Every Day  ?  Packs/day: 1.00  ?  Types: Cigarettes  ? Smokeless tobacco: Former  ?Substance and Sexual Activity  ? Alcohol use: Not Currently  ? Drug use: Not Currently  ?  Types: Marijuana, Cocaine  ? Sexual activity: Yes  ?Other Topics Concern  ? Not on file  ?Social History Narrative  ? Not on file  ? ?Social Determinants of Health  ? ?Financial Resource Strain: High Risk  ? Difficulty of Paying Living Expenses: Hard  ?Food Insecurity: No Food Insecurity  ? Worried About Programme researcher, broadcasting/film/video in the Last  Year: Never true  ? Ran Out of Food in the Last Year: Never true  ?Transportation Needs: No Transportation Needs  ? Lack of Transportation (Medical): No  ? Lack of Transportation (Non-Medical): No  ?Physical Activity: Not on file  ?Stress: Not on file  ?Social Connections: Not on file  ?Intimate Partner Violence: Not on file  ? ?Family History  ?Problem Relation Age of Onset  ? CVA Mother   ? Hypercholesterolemia Mother   ? ?BP 120/80   Pulse 77   Wt 92.8 kg (204 lb 9.6 oz)   SpO2 99%   BMI 30.21 kg/m?  ? ?Wt Readings from Last 3 Encounters:  ?06/14/21 92.8 kg (204 lb 9.6 oz)  ?05/10/21 93.8 kg (206 lb 12.8 oz)  ?04/19/21 96.8 kg (213 lb 6.4 oz)  ? ?PHYSICAL EXAM: ?General:  NAD. No resp difficulty ?HEENT: Normal ?Neck: Supple. No JVD. Carotids 2+ bilat; no bruits. No lymphadenopathy or thryomegaly appreciated. ?Cor: PMI nondisplaced. Regular rate & rhythm. No rubs, gallops or murmurs. ?Lungs: Clear ?Abdomen: Soft, nontender, nondistended. No hepatosplenomegaly. No bruits or masses. Good bowel sounds. ?Extremities: No cyanosis, clubbing, rash, edema ?Neuro: Alert & oriented x 3, cranial nerves grossly intact. Moves all 4 extremities w/o difficulty. Affect pleasant. ? ?ASSESSMENT & PLAN: ?Chronic HFrEF ?- Echo (1/23): EF 20-25% RV normal.  ?- LHC (1/23) with DES to LAD.  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  ?- NYHA II. Volume status stable.  ?- No bb with recent bradycardia and cocaine use. ?- Continue Farxiga 10 mg daily. ?- Continue Entresto 24-26 mg bid. Will not increase today with positional dizziness. ?- Continue spironolactone 12.5 mg qhs. ?- He is adamant he does not want to wear a life vest, discussed at length previous visit.  ?- Plan to repeat echo next visit. ?- Labs reviewed from 05/20/21 and look ok, K 4.2, SCr 0.99 ?  ?2. CAD  ?- S/P PCI DES to prox LAD (1/23). Of note he left AMA on 04/15/21 and did not have medications.  ?- Continue atorvastatin 80 mg daily. ?- Continue Brillinta 90 mg  bid. ?- Continue ASA 81 mg daily.  ?- No chest pain.  ?  ?3. Tobacco Use ?- Down to 5 cigs/day ?- Discussed cessation.  ?  ?4. Cocaine Abuse ?- Last used 03/2021. ?- Discussed cessation.  ? ?5. Pre-Diabetes ?- A1C 5.7 (2/23). ?- On Farxiga.  ?  ?Follow up in 2 months with Dr. Gala Romney + echo.  ? ?Prince Rome, FNP-BC ?06/14/21 ? ?

## 2021-06-16 ENCOUNTER — Other Ambulatory Visit (HOSPITAL_COMMUNITY): Payer: Self-pay

## 2021-06-16 MED ORDER — ENTRESTO 24-26 MG PO TABS: 1.0000 | ORAL_TABLET | Freq: Two times a day (BID) | ORAL | 3 refills | Status: DC

## 2021-06-16 NOTE — Telephone Encounter (Signed)
Sent in both applications via fax. Documents scanned to chart.  ? ?Will follow up. ? ?

## 2021-06-22 ENCOUNTER — Other Ambulatory Visit (HOSPITAL_COMMUNITY): Payer: Self-pay

## 2021-06-22 ENCOUNTER — Other Ambulatory Visit (HOSPITAL_COMMUNITY): Payer: Self-pay | Admitting: *Deleted

## 2021-06-22 MED ORDER — DAPAGLIFLOZIN PROPANEDIOL 10 MG PO TABS
10.0000 mg | ORAL_TABLET | Freq: Every day | ORAL | 3 refills | Status: AC
  Filled 2021-06-22 – 2021-07-05 (×2): qty 90, 90d supply, fill #0
  Filled 2022-01-06: qty 90, 90d supply, fill #1
  Filled 2022-05-13: qty 90, 90d supply, fill #2

## 2021-06-22 MED ORDER — ENTRESTO 24-26 MG PO TABS
1.0000 | ORAL_TABLET | Freq: Two times a day (BID) | ORAL | 3 refills | Status: AC
  Filled 2021-06-22 – 2021-07-05 (×2): qty 180, 90d supply, fill #0
  Filled 2022-01-06: qty 180, 90d supply, fill #1
  Filled 2022-05-13: qty 180, 90d supply, fill #2

## 2021-06-22 MED ORDER — TICAGRELOR 90 MG PO TABS
90.0000 mg | ORAL_TABLET | Freq: Two times a day (BID) | ORAL | 6 refills | Status: DC
  Filled 2021-06-22 – 2021-07-05 (×2): qty 180, 90d supply, fill #0
  Filled 2022-01-06: qty 180, 90d supply, fill #1
  Filled 2022-05-13: qty 180, 90d supply, fill #2

## 2021-06-22 NOTE — Telephone Encounter (Signed)
Called and spoke with patient's mother. She stated that she is uncomfortable with giving her financial information to [employer] and that the patient was approved for medicaid for the next year. Should not need manufacturer assistance as long as PA's are approved.  ? ?Patient Advocate Encounter ?  ?Received notification from [facility] [place] that prior authorization for Sherryll Burger is required. ?  ?PA submitted on CoverMyMeds ?Key [id number] ?Status is pending ?  ?Will continue to follow. ? ?Patient Advocate Encounter ?  ?Received notification from [facility] [place] that prior authorization for Marcelline Deist is required. ?  ?PA submitted on CoverMyMeds ?Key [id number] ?Status is pending ?  ?Will continue to follow. ? ?Brilinta does not need a PA. Current 90 day co-pay $0 ?

## 2021-06-22 NOTE — Telephone Encounter (Signed)
Advanced Heart Failure Patient Advocate Encounter ? ?Prior Authorization for Sherryll Burger has been approved.   ? ?PA#  [id number] ?Effective dates: 06/08/21 through 06/22/22 ? ?Patients co-pay is $0 (90 days) ? ?Advanced Heart Failure Patient Advocate Encounter ? ?Prior Authorization for Marcelline Deist has been approved.   ? ?PA# [id number] ?Effective dates: 06/22/21 through Until further notice ? ?Patients co-pay is $0 (90 days) ? ?Sent 90 day RX request to [facility] to send to [facility] outpatient. ? ?Archer Asa, CPhT ?  ? ?

## 2021-06-26 DIAGNOSIS — Z419 Encounter for procedure for purposes other than remedying health state, unspecified: Secondary | ICD-10-CM | POA: Diagnosis not present

## 2021-06-30 ENCOUNTER — Other Ambulatory Visit (HOSPITAL_COMMUNITY): Payer: Self-pay

## 2021-07-05 ENCOUNTER — Other Ambulatory Visit (HOSPITAL_COMMUNITY): Payer: Self-pay

## 2021-07-17 ENCOUNTER — Other Ambulatory Visit (HOSPITAL_COMMUNITY): Payer: Self-pay

## 2021-07-26 DIAGNOSIS — Z419 Encounter for procedure for purposes other than remedying health state, unspecified: Secondary | ICD-10-CM | POA: Diagnosis not present

## 2021-08-11 ENCOUNTER — Ambulatory Visit (HOSPITAL_COMMUNITY)
Admission: RE | Admit: 2021-08-11 | Discharge: 2021-08-11 | Disposition: A | Discharge status: Home / Self Care | Payer: Medicaid Other | Source: Ambulatory Visit | Attending: Family Medicine | Admitting: Family Medicine

## 2021-08-11 ENCOUNTER — Encounter (HOSPITAL_COMMUNITY): Payer: Self-pay | Admitting: Internal Medicine

## 2021-08-11 ENCOUNTER — Ambulatory Visit (HOSPITAL_BASED_OUTPATIENT_CLINIC_OR_DEPARTMENT_OTHER)
Admission: RE | Admit: 2021-08-11 | Discharge: 2021-08-11 | Disposition: A | Discharge status: Home / Self Care | Payer: Medicaid Other | Source: Ambulatory Visit | Attending: Internal Medicine | Admitting: Internal Medicine

## 2021-08-11 VITALS — BP 118/70 | HR 61 | Wt 201.8 lb

## 2021-08-11 DIAGNOSIS — Z720 Tobacco use: Secondary | ICD-10-CM | POA: Diagnosis not present

## 2021-08-11 DIAGNOSIS — Z9861 Coronary angioplasty status: Secondary | ICD-10-CM | POA: Diagnosis not present

## 2021-08-11 DIAGNOSIS — I493 Ventricular premature depolarization: Secondary | ICD-10-CM | POA: Insufficient documentation

## 2021-08-11 DIAGNOSIS — Z8674 Personal history of sudden cardiac arrest: Secondary | ICD-10-CM | POA: Diagnosis not present

## 2021-08-11 DIAGNOSIS — R0602 Shortness of breath: Secondary | ICD-10-CM | POA: Diagnosis not present

## 2021-08-11 DIAGNOSIS — F17210 Nicotine dependence, cigarettes, uncomplicated: Secondary | ICD-10-CM | POA: Insufficient documentation

## 2021-08-11 DIAGNOSIS — Z79899 Other long term (current) drug therapy: Secondary | ICD-10-CM | POA: Insufficient documentation

## 2021-08-11 DIAGNOSIS — Z7982 Long term (current) use of aspirin: Secondary | ICD-10-CM | POA: Insufficient documentation

## 2021-08-11 DIAGNOSIS — J45909 Unspecified asthma, uncomplicated: Secondary | ICD-10-CM | POA: Diagnosis not present

## 2021-08-11 DIAGNOSIS — I2510 Atherosclerotic heart disease of native coronary artery without angina pectoris: Secondary | ICD-10-CM | POA: Diagnosis not present

## 2021-08-11 DIAGNOSIS — Z955 Presence of coronary angioplasty implant and graft: Secondary | ICD-10-CM | POA: Diagnosis not present

## 2021-08-11 DIAGNOSIS — R7303 Prediabetes: Secondary | ICD-10-CM | POA: Diagnosis not present

## 2021-08-11 DIAGNOSIS — Z716 Tobacco abuse counseling: Secondary | ICD-10-CM | POA: Insufficient documentation

## 2021-08-11 DIAGNOSIS — Z7984 Long term (current) use of oral hypoglycemic drugs: Secondary | ICD-10-CM | POA: Insufficient documentation

## 2021-08-11 DIAGNOSIS — I5042 Chronic combined systolic (congestive) and diastolic (congestive) heart failure: Secondary | ICD-10-CM | POA: Diagnosis not present

## 2021-08-11 DIAGNOSIS — F1410 Cocaine abuse, uncomplicated: Secondary | ICD-10-CM | POA: Insufficient documentation

## 2021-08-11 DIAGNOSIS — I5022 Chronic systolic (congestive) heart failure: Secondary | ICD-10-CM | POA: Diagnosis not present

## 2021-08-11 DIAGNOSIS — F05 Delirium due to known physiological condition: Secondary | ICD-10-CM | POA: Diagnosis not present

## 2021-08-11 LAB — BASIC METABOLIC PANEL
Anion gap: 4 — ABNORMAL LOW (ref 5–15)
BUN: 9 mg/dL (ref 6–20)
CO2: 27 mmol/L (ref 22–32)
Calcium: 9.5 mg/dL (ref 8.9–10.3)
Chloride: 110 mmol/L (ref 98–111)
Creatinine, Ser: 1.02 mg/dL (ref 0.61–1.24)
GFR, Estimated: >60 mL/min (ref >60)
Glucose, Bld: 102 mg/dL — ABNORMAL HIGH (ref 70–99)
Potassium: 4.5 mmol/L (ref 3.5–5.1)
Sodium: 141 mmol/L (ref 135–145)

## 2021-08-11 LAB — ECHOCARDIOGRAM COMPLETE
Area-P 1/2: 3.37 cm2
Calc EF: 63.3 %
S' Lateral: 3.1 cm
Single Plane A2C EF: 61 %
Single Plane A4C EF: 63.2 %

## 2021-08-11 NOTE — Patient Instructions (Addendum)
There has been no changes to your medications. ? ?Labs done today, your results will be available in MyChart, we will contact you for abnormal readings. ? ?Your physician has requested that you have an echocardiogram. Echocardiography is a painless test that uses sound waves to create images of your heart. It provides your doctor with information about the size and shape of your heart and how well your heart?s chambers and valves are working. This procedure takes approximately one hour. There are no restrictions for this procedure. ? ?Your physician recommends that you schedule a follow-up appointment in: 3 months with the Nurse Practitioner  ? ?Your physician recommends that you schedule a follow-up appointment in: 6 months with Echocardiogram  (November 2023)  **PLEASE CALL THE OFFICE IN AUGUST TO ARRANGE YOUR FOLLOW UP APPOINTMENT ** ? ?If you have any questions or concerns before your next appointment please send [place] a message through [place] or call our office at [phone].   ? ?TO LEAVE A MESSAGE FOR THE NURSE SELECT OPTION 2, PLEASE LEAVE A MESSAGE INCLUDING: ?YOUR NAME ?DATE OF BIRTH ?CALL BACK NUMBER ?REASON FOR CALL**this is important as we prioritize the call backs ? ?YOU WILL RECEIVE A CALL BACK THE SAME DAY AS LONG AS YOU CALL BEFORE 4:00 PM ? ?At the [facility], you and your health needs are our priority. As part of our continuing mission to provide you with exceptional heart care, we have created designated Provider Care Teams. These Care Teams include your primary Cardiologist (physician) and Advanced Practice Providers (APPs- Physician Assistants and Nurse Practitioners) who all work together to provide you with the care you need, when you need it.  ? ?You may see any of the following providers on your designated Care Team at your next follow up: ?Dr Arvilla Meres ?Dr Marca Ancona ?Tonye Becket, NP ?Robbie Lis, PA ?Jessica Milford,NP ?Anna Genre, PA ?Karle Plumber,  PharmD ? ? ?Please be sure to bring in all your medications bottles to every appointment.  ? ? ? ?

## 2021-08-11 NOTE — Progress Notes (Signed)
?  Echocardiogram ?2D Echocardiogram has been performed. ? ?Joe Miranda ?08/11/2021, 1:45 PM ?

## 2021-08-11 NOTE — Progress Notes (Signed)
? ?ADVANCED HF CLINIC NOTE ? ?Primary Care: none ?Primary Cardiologist: Dr. Katrinka Blazing ?HF Cardiologist: Dr. Gala Romney ? ?HPI: ?Mr Hodkinson s a 41 y.o. with a history of asthma, cocaine use. Recent admit had DES to proximal LAD and new HFrEF.   ?Prior to admit he had been having chest pain and had and ED visit in the fall but left before he was seen.  ?  ?Admitted 04/13/21 after he was found unresponsive by his girlfriend. Had OOH PEA arrest.  CPR 20 min with ROSC. EKG with ST elevated V2-V4. Had emergency cath and underwent PCI /DES to proximal  LAD. Self extubated on 04/14/21.  Echo with severely reduced EF < 25%. Had frequent PVCs on the monitor. On 1/18 staff reported agitation but improved after haldol and ativan. On 04/15/21 he left AMA without any medications.  ?  ?Follow up in [facility] 1/23 had been off all meds except ASA. Smoking 2-3 cigs/day, no further cocaine use. GDMT restarted.  Lives with his girlfriend and 22 year old stepson.He has not medical insurance. ? ?Seen in UC 05/04/21 for chest pain and SOB. Advised to go to ED but he did not go.  ? ?Followed closely in the HF clinic with GDMT adjusted.    ? ?Today he returns for HF follow up.Overall feeling fine. SOB with brisk walking. Denies PND/Orthopnea. No chest pain. Smoking 3-4 cigarettes per day. Appetite ok. No fever or chills. Weight at home  200-202 pounds. Taking all medications. ?  ?Cardiac Testing  ?- Echo 04/14/21  EF 20-25%. RV normal. Grade I DD.  ?  ?- LHC 04/13/21 -LAD 99% proximal  PCI /DES . All other vessels ok.    ? ?ROS: All systems reviewed and negative except as per HPI.  ? ?Past Medical History:  ?Diagnosis Date  ? Asthma   ? Heroin abuse (HCC)   ? ?Current Outpatient Medications  ?Medication Sig Dispense Refill  ? acetaminophen (TYLENOL) 325 MG tablet Take 650 mg by mouth every 4 (four) hours as needed.    ? aspirin EC 81 MG tablet Take 81 mg by mouth daily. Swallow whole.    ? atorvastatin (LIPITOR) 80 MG tablet Take 1 tablet (80 mg total) by  mouth daily. 30 tablet 11  ? dapagliflozin propanediol (FARXIGA) 10 MG TABS tablet Take 1 tablet (10 mg total) by mouth daily before breakfast. 90 tablet 3  ? sacubitril-valsartan (ENTRESTO) 24-26 MG Take 1 tablet by mouth 2 (two) times daily. 180 tablet 3  ? spironolactone (ALDACTONE) 25 MG tablet Take 1/2 tablet (12.5 mg total) by mouth at bedtime. 30 tablet 11  ? ticagrelor (BRILINTA) 90 MG TABS tablet Take 1 tablet (90 mg total) by mouth 2 (two) times daily. 180 tablet 6  ? ?No current facility-administered medications for this encounter.  ? ?No Known Allergies ? ?Social History  ? ?Socioeconomic History  ? Marital status: Single  ?  Spouse name: Not on file  ? Number of children: Not on file  ? Years of education: Not on file  ? Highest education level: GED or equivalent  ?Occupational History  ? Not on file  ?Tobacco Use  ? Smoking status: Every Day  ?  Packs/day: 1.00  ?  Types: Cigarettes  ? Smokeless tobacco: Former  ?Substance and Sexual Activity  ? Alcohol use: Not Currently  ? Drug use: Not Currently  ?  Types: Marijuana, Cocaine  ? Sexual activity: Yes  ?Other Topics Concern  ? Not on file  ?Social History  Narrative  ? Not on file  ? ?Social Determinants of Health  ? ?Financial Resource Strain: High Risk  ? Difficulty of Paying Living Expenses: Hard  ?Food Insecurity: No Food Insecurity  ? Worried About Programme researcher, broadcasting/film/video in the Last Year: Never true  ? Ran Out of Food in the Last Year: Never true  ?Transportation Needs: No Transportation Needs  ? Lack of Transportation (Medical): No  ? Lack of Transportation (Non-Medical): No  ?Physical Activity: Not on file  ?Stress: Not on file  ?Social Connections: Not on file  ?Intimate Partner Violence: Not on file  ? ?Family History  ?Problem Relation Age of Onset  ? CVA Mother   ? Hypercholesterolemia Mother   ? ?BP 118/70   Pulse 61   Wt 91.5 kg (201 lb 12.8 oz)   SpO2 99%   BMI 29.80 kg/m?  ? ?Wt Readings from Last 3 Encounters:  ?08/11/21 91.5 kg (201 lb  12.8 oz)  ?06/14/21 92.8 kg (204 lb 9.6 oz)  ?05/10/21 93.8 kg (206 lb 12.8 oz)  ? ?PHYSICAL EXAM: ?General:  Well appearing. No resp difficulty ?HEENT: normal ?Neck: supple. no JVD. Carotids 2+ bilat; no bruits. No lymphadenopathy or thryomegaly appreciated. ?Cor: PMI nondisplaced. Regular rate & rhythm. No rubs, gallops or murmurs. ?Lungs: clear ?Abdomen: soft, nontender, nondistended. No hepatosplenomegaly. No bruits or masses. Good bowel sounds. ?Extremities: no cyanosis, clubbing, rash, edema ?Neuro: alert & orientedx3, cranial nerves grossly intact. moves all 4 extremities w/o difficulty. Affect pleasant ? ?ASSESSMENT & PLAN: ?Chronic HFrEF ?- Echo (1/23): EF 20-25% RV normal.  ?- LHC (1/23) with DES to LAD.  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  ?- ECHO EF 60-65% !!! Discussed results.   ?- NYHA II. Volume status stable.   ?- No bb with recent bradycardia and cocaine use. ?- Continue Farxiga 10 mg daily. ?- Continue Entresto 24-26 mg bid.  ?- Continue spironolactone 25  mg qhs. ?  ?2. CAD  ?- S/P PCI DES to prox LAD (1/23).  ?- No chest pain.  ?- Continue atorvastatin 80 mg daily. ?- Continue Brillinta 90 mg bid. ?- Continue ASA 81 mg daily.  ?  ?3. Tobacco Use ?- Discussed cessation.  ?- Discussed cessation.  ?  ?4. Cocaine Abuse ?- Last used 03/2021. ?- Discussed cessation.  ? ?5. Pre-Diabetes ?- A1C 5.7 (2/23). ?- On Farxiga.  ? ?Follow up with APP in 3 months and 6 months with dr Leory Plowman and an ECHO.  ? ?Tonye Becket NP-C  ?2:55 PM ? 08/11/21 ? ?Patient seen and examined with the above-signed Advanced Practice Provider and/or Housestaff. I personally reviewed laboratory data, imaging studies and relevant notes. I independently examined the patient and formulated the important aspects of the plan. I have edited the note to reflect any of my changes or salient points. I have personally discussed the plan with the patient and/or family. ? ?Feeling much better. Denies SOB, orthopnea or PND. No CP. Still  smoking 2 cigs/day ? ?General:  Well appearing. No resp difficulty ?HEENT: normal ?Neck: supple. no JVD. Carotids 2+ bilat; no bruits. No lymphadenopathy or thryomegaly appreciated. ?Cor: PMI nondisplaced. Regular rate & rhythm. No rubs, gallops or murmurs. ?Lungs: clear ?Abdomen: soft, nontender, nondistended. No hepatosplenomegaly. No bruits or masses. Good bowel sounds. ?Extremities: no cyanosis, clubbing, rash, edema ?Neuro: alert & orientedx3, cranial nerves grossly intact. moves all 4 extremities w/o difficulty. Affect pleasant ? ?Echo today reviewed personally. EF 60-65%. No RWMA. Continue current meds. Encouraged smoking cessation. DAPT  x 1 year.  ? ?Arvilla Meresaniel Viera Okonski, MD  ?5:21 PM ? ?

## 2021-08-26 DIAGNOSIS — Z419 Encounter for procedure for purposes other than remedying health state, unspecified: Secondary | ICD-10-CM | POA: Diagnosis not present

## 2021-09-15 ENCOUNTER — Telehealth (HOSPITAL_COMMUNITY): Payer: Self-pay | Admitting: *Deleted

## 2021-09-15 NOTE — Telephone Encounter (Signed)
Pts mom Kathie Rhodes left vm requesting a return call at [phone] about insurance not covering echo on 5/17. Kathie Rhodes said insurance told her they reached out to our office to prove medical necessity but had not heard back.  Routed to Philicia Branch,CMA

## 2021-10-26 DIAGNOSIS — Z419 Encounter for procedure for purposes other than remedying health state, unspecified: Secondary | ICD-10-CM | POA: Diagnosis not present

## 2021-11-10 NOTE — Progress Notes (Incomplete)
ADVANCED HF CLINIC NOTE  Primary Care: none Primary Cardiologist: Dr. Katrinka Blazing HF Cardiologist: Dr. Gala Romney  HPI: Mr Laughery s a 41 y.o. with a history of asthma, cocaine use, CAD s/p anterior STEMI, chronic systolic CHF with recovered EF.    Admitted 04/13/21 after he was found unresponsive by his girlfriend. Had OOH PEA arrest.  CPR 20 min with ROSC.In ED found to have anterior ST elevations on ECG. Had emergency cath and underwent PCI /DES to proximal  LAD. Self extubated on 04/14/21.  Echo with severely reduced EF < 25%. Had frequent PVCs on the monitor. On 04/15/21 he left AMA without any medications.    Follow up in [facility] 1/23 had been off all meds except ASA. Smoking 2-3 cigs/day, no further cocaine use. GDMT restarted.    Seen in UC 05/04/21 for chest pain and SOB. Advised to go to ED but he did not go.   Seen for follow-up 05/23. Echo with improvement in EF to 60-65%.   He is here today for 3 month follow-up.   Cardiac Testing  - Echo 04/14/21  EF 20-25%. RV normal. Grade I DD.    - LHC 04/13/21 -LAD 99% proximal  PCI /DES . All other vessels ok.     ROS: All systems reviewed and negative except as per HPI.   Past Medical History:  Diagnosis Date   Asthma    Heroin abuse (HCC)    Current Outpatient Medications  Medication Sig Dispense Refill   acetaminophen (TYLENOL) 325 MG tablet Take 650 mg by mouth every 4 (four) hours as needed.     aspirin EC 81 MG tablet Take 81 mg by mouth daily. Swallow whole.     atorvastatin (LIPITOR) 80 MG tablet Take 1 tablet (80 mg total) by mouth daily. 30 tablet 11   dapagliflozin propanediol (FARXIGA) 10 MG TABS tablet Take 1 tablet (10 mg total) by mouth daily before breakfast. 90 tablet 3   sacubitril-valsartan (ENTRESTO) 24-26 MG Take 1 tablet by mouth 2 (two) times daily. 180 tablet 3   spironolactone (ALDACTONE) 25 MG tablet Take 1/2 tablet (12.5 mg total) by mouth at bedtime. 30 tablet 11   ticagrelor (BRILINTA) 90 MG TABS tablet Take  1 tablet (90 mg total) by mouth 2 (two) times daily. 180 tablet 6   No current facility-administered medications for this visit.   No Known Allergies  Social History   Socioeconomic History   Marital status: Single    Spouse name: Not on file   Number of children: Not on file   Years of education: Not on file   Highest education level: GED or equivalent  Occupational History   Not on file  Tobacco Use   Smoking status: Every Day    Packs/day: 1.00    Types: Cigarettes   Smokeless tobacco: Former  Substance and Sexual Activity   Alcohol use: Not Currently   Drug use: Not Currently    Types: Marijuana, Cocaine   Sexual activity: Yes  Other Topics Concern   Not on file  Social History Narrative   Not on file   Social Determinants of Health   Financial Resource Strain: High Risk (04/19/2021)   Overall Financial Resource Strain (CARDIA)    Difficulty of Paying Living Expenses: Hard  Food Insecurity: No Food Insecurity (04/19/2021)   Hunger Vital Sign    Worried About Running Out of Food in the Last Year: Never true    Ran Out of Food in the Last Year:  Never true  Transportation Needs: No Transportation Needs (04/19/2021)   PRAPARE - Administrator, Civil Service (Medical): No    Lack of Transportation (Non-Medical): No  Physical Activity: Not on file  Stress: Not on file  Social Connections: Not on file  Intimate Partner Violence: Not on file   Family History  Problem Relation Age of Onset   CVA Mother    Hypercholesterolemia Mother    There were no vitals taken for this visit.  Wt Readings from Last 3 Encounters:  08/11/21 91.5 kg (201 lb 12.8 oz)  06/14/21 92.8 kg (204 lb 9.6 oz)  05/10/21 93.8 kg (206 lb 12.8 oz)   PHYSICAL EXAM: General:  Well appearing. No resp difficulty HEENT: normal Neck: supple. no JVD. Carotids 2+ bilat; no bruits. No lymphadenopathy or thryomegaly appreciated. Cor: PMI nondisplaced. Regular rate & rhythm. No rubs, gallops  or murmurs. Lungs: clear Abdomen: soft, nontender, nondistended. No hepatosplenomegaly. No bruits or masses. Good bowel sounds. Extremities: no cyanosis, clubbing, rash, edema Neuro: alert & orientedx3, cranial nerves grossly intact. moves all 4 extremities w/o difficulty. Affect pleasant  ASSESSMENT & PLAN: Chronic HFrEF - Echo (1/23): EF 20-25% RV normal.  - LHC (1/23) with DES to LAD.  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  - ECHO (05/23) EF 60-65%  - NYHA II. Volume *** - No bb with recent bradycardia and cocaine use. - Continue Farxiga 10 mg daily. - Continue Entresto 24-26 mg bid.  - Continue spironolactone 25  mg qhs.   2. CAD  - S/P PCI DES to prox LAD (1/23).  - No chest pain.  - Continue atorvastatin 80 mg daily. - Continue Brillinta 90 mg bid. - Continue ASA 81 mg daily.    3. Tobacco Use  - Discussed cessation.    4. Cocaine Abuse - Last used 03/2021. - Discussed cessation.   5. Pre-Diabetes - A1C 5.7 (2/23). - On Farxiga.   Follow up  3 months with dr Leory Plowman and an ECHO.   Alahia Whicker N PA-C 8:07 PM  11/10/21

## 2021-11-11 ENCOUNTER — Ambulatory Visit (HOSPITAL_COMMUNITY)
Admission: RE | Admit: 2021-11-11 | Discharge: 2021-11-11 | Disposition: A | Discharge status: Home / Self Care | Payer: Medicaid Other | Source: Ambulatory Visit | Attending: Physician Assistant | Admitting: Physician Assistant

## 2021-11-11 ENCOUNTER — Encounter (HOSPITAL_COMMUNITY): Payer: Self-pay

## 2021-11-11 VITALS — BP 124/82 | HR 80 | Wt 205.4 lb

## 2021-11-11 DIAGNOSIS — F17210 Nicotine dependence, cigarettes, uncomplicated: Secondary | ICD-10-CM | POA: Insufficient documentation

## 2021-11-11 DIAGNOSIS — J45909 Unspecified asthma, uncomplicated: Secondary | ICD-10-CM | POA: Insufficient documentation

## 2021-11-11 DIAGNOSIS — Z7982 Long term (current) use of aspirin: Secondary | ICD-10-CM | POA: Insufficient documentation

## 2021-11-11 DIAGNOSIS — Z79899 Other long term (current) drug therapy: Secondary | ICD-10-CM | POA: Insufficient documentation

## 2021-11-11 DIAGNOSIS — R7303 Prediabetes: Secondary | ICD-10-CM | POA: Diagnosis not present

## 2021-11-11 DIAGNOSIS — Z7984 Long term (current) use of oral hypoglycemic drugs: Secondary | ICD-10-CM | POA: Insufficient documentation

## 2021-11-11 DIAGNOSIS — Z955 Presence of coronary angioplasty implant and graft: Secondary | ICD-10-CM | POA: Diagnosis not present

## 2021-11-11 DIAGNOSIS — I5020 Unspecified systolic (congestive) heart failure: Secondary | ICD-10-CM | POA: Diagnosis not present

## 2021-11-11 DIAGNOSIS — I252 Old myocardial infarction: Secondary | ICD-10-CM | POA: Diagnosis not present

## 2021-11-11 DIAGNOSIS — F1410 Cocaine abuse, uncomplicated: Secondary | ICD-10-CM | POA: Insufficient documentation

## 2021-11-11 DIAGNOSIS — I5042 Chronic combined systolic (congestive) and diastolic (congestive) heart failure: Secondary | ICD-10-CM | POA: Insufficient documentation

## 2021-11-11 DIAGNOSIS — I2510 Atherosclerotic heart disease of native coronary artery without angina pectoris: Secondary | ICD-10-CM | POA: Diagnosis not present

## 2021-11-11 DIAGNOSIS — Z720 Tobacco use: Secondary | ICD-10-CM | POA: Diagnosis not present

## 2021-11-11 LAB — BASIC METABOLIC PANEL
Anion gap: 4 — ABNORMAL LOW (ref 5–15)
BUN: 9 mg/dL (ref 6–20)
CO2: 27 mmol/L (ref 22–32)
Calcium: 8.9 mg/dL (ref 8.9–10.3)
Chloride: 110 mmol/L (ref 98–111)
Creatinine, Ser: 0.96 mg/dL (ref 0.61–1.24)
GFR, Estimated: >60 mL/min (ref >60)
Glucose, Bld: 93 mg/dL (ref 70–99)
Potassium: 4.2 mmol/L (ref 3.5–5.1)
Sodium: 141 mmol/L (ref 135–145)

## 2021-11-11 LAB — LIPID PANEL
Cholesterol: 156 mg/dL (ref 0–200)
HDL: 46 mg/dL (ref >40)
LDL Cholesterol: 100 mg/dL — ABNORMAL HIGH (ref 0–99)
Total CHOL/HDL Ratio: 3.4 RATIO
Triglycerides: 48 mg/dL (ref <150)
VLDL: 10 mg/dL (ref 0–40)

## 2021-11-11 LAB — BRAIN NATRIURETIC PEPTIDE: B Natriuretic Peptide: 69.9 pg/mL (ref 0.0–100.0)

## 2021-11-11 NOTE — Patient Instructions (Signed)
There has been no changes to your medications.  Labs done today, your results will be available in MyChart, we will contact you for abnormal readings.  Your physician has requested that you have an echocardiogram. Echocardiography is a painless test that uses sound waves to create images of your heart. It provides your doctor with information about the size and shape of your heart and how well your heart's chambers and valves are working. This procedure takes approximately one hour. There are no restrictions for this procedure.  Your physician recommends that you schedule a follow-up appointment in: 3 months  If you have any questions or concerns before your next appointment please send us a message through mychart or call our office at [phone].    TO LEAVE A MESSAGE FOR THE NURSE SELECT OPTION 2, PLEASE LEAVE A MESSAGE INCLUDING: YOUR NAME DATE OF BIRTH CALL BACK NUMBER REASON FOR CALL**this is important as we prioritize the call backs  YOU WILL RECEIVE A CALL BACK THE SAME DAY AS LONG AS YOU CALL BEFORE 4:00 PM  At the [facility], you and your health needs are our priority. As part of our continuing mission to provide you with exceptional heart care, we have created designated Provider Care Teams. These Care Teams include your primary Cardiologist (physician) and Advanced Practice Providers (APPs- Physician Assistants and Nurse Practitioners) who all work together to provide you with the care you need, when you need it.   You may see any of the following providers on your designated Care Team at your next follow up: Dr Daniel Bensimhon Dr Dalton McLean Amy Clegg, NP Brittainy Simmons, PA Jessica Milford,NP Lindsay Finch, PA Lauren Kemp, PharmD   Please be sure to bring in all your medications bottles to every appointment.    

## 2021-11-12 ENCOUNTER — Telehealth (HOSPITAL_COMMUNITY): Payer: Self-pay | Admitting: *Deleted

## 2021-11-12 NOTE — Telephone Encounter (Signed)
Called patient and left message per Unice Cobble, PA. Labs from last visit were normal except for slightly elevated LDL. Asked patient to make sure and take his atorvastatin daily as prescribed. Contact # given if patient has any questions.  Hessie Diener RN

## 2021-11-26 DIAGNOSIS — Z419 Encounter for procedure for purposes other than remedying health state, unspecified: Secondary | ICD-10-CM | POA: Diagnosis not present

## 2021-12-07 ENCOUNTER — Ambulatory Visit: Payer: Medicaid Other | Admitting: Nurse Practitioner

## 2021-12-26 DIAGNOSIS — Z419 Encounter for procedure for purposes other than remedying health state, unspecified: Secondary | ICD-10-CM | POA: Diagnosis not present

## 2022-01-06 ENCOUNTER — Other Ambulatory Visit (HOSPITAL_COMMUNITY): Payer: Self-pay

## 2022-01-26 DIAGNOSIS — Z419 Encounter for procedure for purposes other than remedying health state, unspecified: Secondary | ICD-10-CM | POA: Diagnosis not present

## 2022-01-31 NOTE — Progress Notes (Signed)
ADVANCED HF CLINIC NOTE  Primary Care: none Primary Cardiologist: Dr. Tamala Julian HF Cardiologist: Dr. Haroldine Laws  HPI: [name] s a 41 y.o. with a history of asthma, cocaine use. Recent admit had DES to proximal LAD and new HFrEF.   Prior to admit he had been having chest pain and had and ED visit in the fall but left before he was seen.    Admitted 04/13/21 after he was found unresponsive by his girlfriend. Had OOH PEA arrest.  CPR 20 min with ROSC. EKG with ST elevated V2-V4. Had emergency cath and underwent PCI /DES to proximal  LAD. Self extubated on 04/14/21.  Echo with severely reduced EF < 25%. Had frequent PVCs on the monitor. On 1/18 staff reported agitation but improved after haldol and ativan. On 04/15/21 he left AMA without any medications.    Follow up in [facility] 1/23 had been off all meds except ASA. Smoking 2-3 cigs/day, no further cocaine use. GDMT restarted.  Lives with his girlfriend and 25 year old stepson.He has not medical insurance.  Seen in UC 05/04/21 for chest pain and SOB. Advised to go to ED but he did not go.   Followed closely in the HF clinic with GDMT adjusted.     Today he returns for HF follow up.Overall feeling fine. SOB with brisk walking. Denies PND/Orthopnea. No chest pain. Smoking 3-4 cigarettes per day. Appetite ok. No fever or chills. Weight at home  200-202 pounds. Taking all medications.   Cardiac Testing  - Echo 04/14/21  EF 20-25%. RV normal. Grade I DD.    - LHC 04/13/21 -LAD 99% proximal  PCI /DES . All other vessels ok.     ROS: All systems reviewed and negative except as per HPI.   Past Medical History:  Diagnosis Date   Asthma    Heroin abuse ([place])    Current Outpatient Medications  Medication Sig Dispense Refill   acetaminophen (TYLENOL) 325 MG tablet Take 650 mg by mouth every 4 (four) hours as needed.     aspirin EC 81 MG tablet Take 81 mg by mouth daily. Swallow whole.     atorvastatin (LIPITOR) 80 MG tablet Take 1 tablet (80 mg total) by  mouth daily. 30 tablet 11   dapagliflozin propanediol (FARXIGA) 10 MG TABS tablet Take 1 tablet (10 mg total) by mouth daily before breakfast. 90 tablet 3   sacubitril-valsartan (ENTRESTO) 24-26 MG Take 1 tablet by mouth 2 (two) times daily. 180 tablet 3   spironolactone (ALDACTONE) 25 MG tablet Take 1/2 tablet (12.5 mg total) by mouth at bedtime. 30 tablet 11   ticagrelor (BRILINTA) 90 MG TABS tablet Take 1 tablet (90 mg total) by mouth 2 (two) times daily. 180 tablet 6   No current facility-administered medications for this encounter.   No Known Allergies  Social History   Socioeconomic History   Marital status: Single    Spouse name: Not on file   Number of children: Not on file   Years of education: Not on file   Highest education level: GED or equivalent  Occupational History   Not on file  Tobacco Use   Smoking status: Every Day    Packs/day: 1.00    Types: Cigarettes   Smokeless tobacco: Former  Substance and Sexual Activity   Alcohol use: Not Currently   Drug use: Not Currently    Types: Marijuana, Cocaine   Sexual activity: Yes  Other Topics Concern   Not on file  Social History  Narrative   Not on file   Social Determinants of Health   Financial Resource Strain: High Risk (04/19/2021)   Overall Financial Resource Strain (CARDIA)    Difficulty of Paying Living Expenses: Hard  Food Insecurity: No Food Insecurity (04/19/2021)   Hunger Vital Sign    Worried About Running Out of Food in the Last Year: Never true    Ran Out of Food in the Last Year: Never true  Transportation Needs: No Transportation Needs (04/19/2021)   PRAPARE - Administrator, Civil Service (Medical): No    Lack of Transportation (Non-Medical): No  Physical Activity: Not on file  Stress: Not on file  Social Connections: Not on file  Intimate Partner Violence: Not on file   Family History  Problem Relation Age of Onset   CVA Mother    Hypercholesterolemia Mother    There were no  vitals taken for this visit.  Wt Readings from Last 3 Encounters:  11/11/21 93.2 kg (205 lb 6.4 oz)  08/11/21 91.5 kg (201 lb 12.8 oz)  06/14/21 92.8 kg (204 lb 9.6 oz)   PHYSICAL EXAM: General:  Well appearing. No resp difficulty HEENT: normal Neck: supple. no JVD. Carotids 2+ bilat; no bruits. No lymphadenopathy or thryomegaly appreciated. Cor: PMI nondisplaced. Regular rate & rhythm. No rubs, gallops or murmurs. Lungs: clear Abdomen: soft, nontender, nondistended. No hepatosplenomegaly. No bruits or masses. Good bowel sounds. Extremities: no cyanosis, clubbing, rash, edema Neuro: alert & orientedx3, cranial nerves grossly intact. moves all 4 extremities w/o difficulty. Affect pleasant  ASSESSMENT & PLAN: Chronic HFrEF - Echo (1/23): EF 20-25% RV normal.  - LHC (1/23) with DES to LAD.  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  - ECHO 5/23EF 60-65% !!! Discussed results.   - NYHA II. Volume status stable.   - No bb with recent bradycardia and cocaine use. - Continue Farxiga 10 mg daily. - Continue Entresto 24-26 mg bid.  - Continue spironolactone 25  mg qhs.   2. CAD  - S/P PCI DES to prox LAD (1/23).  - No chest pain.  - Continue atorvastatin 80 mg daily. - Continue Brillinta 90 mg bid. - Continue ASA 81 mg daily.    3. Tobacco Use - Discussed cessation.  - Discussed cessation.    4. Cocaine Abuse - Last used 03/2021. - Discussed cessation.   5. Pre-Diabetes - A1C 5.7 (2/23). - On Farxiga.   Arvilla Meres MD 10:57 PM  01/31/22

## 2022-02-01 ENCOUNTER — Ambulatory Visit (HOSPITAL_COMMUNITY)
Admission: RE | Admit: 2022-02-01 | Discharge: 2022-02-01 | Disposition: A | Discharge status: Home / Self Care | Payer: Medicaid Other | Source: Ambulatory Visit | Attending: Internal Medicine | Admitting: Internal Medicine

## 2022-02-01 ENCOUNTER — Encounter (HOSPITAL_COMMUNITY): Payer: Self-pay | Admitting: Internal Medicine

## 2022-02-01 VITALS — BP 104/70 | HR 66 | Wt 211.8 lb

## 2022-02-01 DIAGNOSIS — I2510 Atherosclerotic heart disease of native coronary artery without angina pectoris: Secondary | ICD-10-CM

## 2022-02-01 DIAGNOSIS — Z79899 Other long term (current) drug therapy: Secondary | ICD-10-CM | POA: Insufficient documentation

## 2022-02-01 DIAGNOSIS — F1410 Cocaine abuse, uncomplicated: Secondary | ICD-10-CM | POA: Diagnosis not present

## 2022-02-01 DIAGNOSIS — Z7982 Long term (current) use of aspirin: Secondary | ICD-10-CM | POA: Diagnosis not present

## 2022-02-01 DIAGNOSIS — R7303 Prediabetes: Secondary | ICD-10-CM | POA: Insufficient documentation

## 2022-02-01 DIAGNOSIS — Z955 Presence of coronary angioplasty implant and graft: Secondary | ICD-10-CM | POA: Insufficient documentation

## 2022-02-01 DIAGNOSIS — I5022 Chronic systolic (congestive) heart failure: Secondary | ICD-10-CM

## 2022-02-01 DIAGNOSIS — R079 Chest pain, unspecified: Secondary | ICD-10-CM | POA: Insufficient documentation

## 2022-02-01 DIAGNOSIS — J45909 Unspecified asthma, uncomplicated: Secondary | ICD-10-CM | POA: Insufficient documentation

## 2022-02-01 DIAGNOSIS — F17210 Nicotine dependence, cigarettes, uncomplicated: Secondary | ICD-10-CM | POA: Diagnosis not present

## 2022-02-01 DIAGNOSIS — E782 Mixed hyperlipidemia: Secondary | ICD-10-CM

## 2022-02-01 DIAGNOSIS — Z720 Tobacco use: Secondary | ICD-10-CM

## 2022-02-01 LAB — ECHOCARDIOGRAM COMPLETE
AR max vel: 4 cm2
AV Area VTI: 3.9 cm2
AV Area mean vel: 3.93 cm2
AV Mean grad: 2 mmHg
AV Peak grad: 3.7 mmHg
Ao pk vel: 0.96 m/s
Area-P 1/2: 3.43 cm2
S' Lateral: 2.8 cm

## 2022-02-01 MED ORDER — EZETIMIBE 10 MG PO TABS: 10.0000 mg | ORAL_TABLET | Freq: Every day | ORAL | 1 refills | Status: AC

## 2022-02-01 NOTE — Addendum Note (Signed)
Encounter addended by: Scarlette Calico, RN on: 02/01/2022 10:40 AM  Actions taken: Pharmacy for encounter modified, Order list changed, Diagnosis association updated, Clinical Note Signed, Specialty comments modified

## 2022-02-01 NOTE — Patient Instructions (Signed)
START Zetia 10 mg Daily  CONGRATULATIONS!!!! You have graduated the [place] have been referred to establish with general cardiology at [facility] in 4 months (March 2024), they will call you to schedule appointment

## 2022-02-01 NOTE — Progress Notes (Signed)
  Echocardiogram 2D Echocardiogram has been performed.  Joe Miranda 02/01/2022, 9:30 AM

## 2022-02-25 DIAGNOSIS — Z419 Encounter for procedure for purposes other than remedying health state, unspecified: Secondary | ICD-10-CM | POA: Diagnosis not present

## 2022-03-28 DIAGNOSIS — Z419 Encounter for procedure for purposes other than remedying health state, unspecified: Secondary | ICD-10-CM | POA: Diagnosis not present

## 2022-04-28 DIAGNOSIS — Z419 Encounter for procedure for purposes other than remedying health state, unspecified: Secondary | ICD-10-CM | POA: Diagnosis not present

## 2022-05-13 ENCOUNTER — Other Ambulatory Visit (HOSPITAL_COMMUNITY): Payer: Self-pay | Admitting: Adult Health

## 2022-05-13 ENCOUNTER — Other Ambulatory Visit (HOSPITAL_COMMUNITY): Payer: Self-pay

## 2022-05-13 MED ORDER — SPIRONOLACTONE 25 MG PO TABS
12.5000 mg | ORAL_TABLET | Freq: Every day | ORAL | 11 refills | Status: AC
  Filled 2022-05-13: qty 30, 60d supply, fill #0

## 2022-05-13 MED ORDER — ATORVASTATIN CALCIUM 80 MG PO TABS
80.0000 mg | ORAL_TABLET | Freq: Every day | ORAL | 11 refills | Status: AC
  Filled 2022-05-13: qty 30, 30d supply, fill #0

## 2022-05-25 ENCOUNTER — Encounter: Payer: Self-pay | Admitting: Internal Medicine

## 2022-05-27 DIAGNOSIS — Z419 Encounter for procedure for purposes other than remedying health state, unspecified: Secondary | ICD-10-CM | POA: Diagnosis not present

## 2022-05-31 ENCOUNTER — Other Ambulatory Visit (HOSPITAL_COMMUNITY): Payer: Self-pay

## 2022-06-06 ENCOUNTER — Ambulatory Visit: Payer: Medicaid Other | Admitting: Internal Medicine

## 2022-06-20 ENCOUNTER — Other Ambulatory Visit (HOSPITAL_COMMUNITY): Payer: Self-pay

## 2022-06-27 DIAGNOSIS — Z419 Encounter for procedure for purposes other than remedying health state, unspecified: Secondary | ICD-10-CM | POA: Diagnosis not present

## 2022-07-01 ENCOUNTER — Ambulatory Visit: Payer: Medicaid Other | Admitting: Internal Medicine

## 2022-07-04 NOTE — Progress Notes (Deleted)
Cardiology Office Note:   Date:  07/04/2022  ID:  Joe Miranda, DOB 05-18-1980, MRN [id number]  History of Present Illness:   Joe Miranda is a 42 y.o. male with history of CAD s/p DES to LAD, HFrEF with EF <25%, asthma and cocaine [place] who was referred by Dr. Gala Romney to establish care with general Cardiology.  Per review of the record, the patient was admitted 04/13/21 after he was found unresponsive by his girlfriend. Had OOH PEA arrest.  CPR 20 min with ROSC. EKG with ST elevated V2-V4. Had emergency cath and underwent PCI /DES to proximal  LAD. Self extubated on 04/14/21.  Echo with severely reduced EF < 25%. Had frequent PVCs on the monitor. On 1/18 staff reported agitation but improved after haldol and ativan. On 04/15/21 he left AMA without any medications.   Followed up with HF clinic and ultimately was placed on and compliant with GDMT. EF 01/2022 showed improvement of EF to 60-65%.  Today, ***  Past Medical History:  Diagnosis Date   Asthma    Chest pain    CHF (congestive heart failure)    Heroin abuse      ROS: ***  Studies Reviewed:    EKG:  ***  Cardiac Studies & Procedures   CARDIAC CATHETERIZATION  CARDIAC CATHETERIZATION 04/14/2021  Narrative CONCLUSIONS: Out of hospital cardiac arrest requiring 20 minutes of CPR to achieve ROSC.  Initial rhythm was pulseless electrical activity. 99% thrombotic proximal LAD with TIMI grade II flow, treated with a 3.5 x 26 drug-eluting stent postdilated to 4.0 mm throughout the stented segment.  TIMI grade III flow reestablished. Normal left main Normal circumflex Normal RCA Moderately severe LV systolic dysfunction with EF estimated 25 to 30% with anteroapical akinesis and other walls hypokinetic.  LVEDP 15 mmHg.  RECOMMENDATIONS:  Brilinta given as a slurry into the patient's NG tube. A bolus followed by an infusion of Aggrastat was started and will run for 4 hours. Critical care medicine will comanage patient. Guarded  prognosis discussed with the patient and family relative to neurological recovery and survival. Statin per injury Subcu Lovenox for DVT prophylaxis  Findings Coronary Findings Diagnostic  Dominance: Right  Left Anterior Descending Prox LAD lesion is 99% stenosed.  Intervention  Prox LAD lesion Stent A stent was successfully placed. Post-Intervention Lesion Assessment The intervention was successful. Pre-interventional TIMI flow is 2. Post-intervention TIMI flow is 3. There is a 0% residual stenosis post intervention.     ECHOCARDIOGRAM  ECHOCARDIOGRAM COMPLETE 02/01/2022  Narrative ECHOCARDIOGRAM REPORT    Patient Name:   Joe Miranda Date of Exam: 02/01/2022 Medical Rec #:  [id number]       Height:       69.0 in Accession #:    [id number]      Weight:       205.4 lb Date of Birth:  11-May-1980       BSA:          2.090 m Patient Age:    41 years        BP:           126/77 mmHg Patient Gender: M               HR:           63 bpm. Exam Location:  Outpatient  Procedure: 2D Echo, Cardiac Doppler and Color Doppler  Indications:    CHF  History:        Patient has prior history of Echocardiogram  examinations, most recent 08/11/2021. CAD and Previous Myocardial Infarction. History of cardiac arrest.  Sonographer:    Ross Ludwig RDCS (AE) Referring Phys: [id number] DANIEL R BENSIMHON  IMPRESSIONS   1. Left ventricular ejection fraction, by estimation, is 60 to 65%. The left ventricle has normal function. The left ventricle has no regional wall motion abnormalities. Left ventricular diastolic parameters were normal. 2. Right ventricular systolic function is normal. The right ventricular size is normal. 3. Left atrial size was mildly dilated. 4. Right atrial size was mildly dilated. 5. The mitral valve is normal in structure. Trivial mitral valve regurgitation. No evidence of mitral stenosis. 6. The aortic valve is normal in structure. Aortic valve regurgitation is not  visualized. No aortic stenosis is present. 7. The inferior vena cava is normal in size with greater than 50% respiratory variability, suggesting right atrial pressure of 3 mmHg.  FINDINGS Left Ventricle: Left ventricular ejection fraction, by estimation, is 60 to 65%. The left ventricle has normal function. The left ventricle has no regional wall motion abnormalities. The left ventricular internal cavity size was normal in size. There is no concentric left ventricular hypertrophy. Left ventricular diastolic parameters were normal.  Right Ventricle: The right ventricular size is normal. No increase in right ventricular wall thickness. Right ventricular systolic function is normal.  Left Atrium: Left atrial size was mildly dilated.  Right Atrium: Right atrial size was mildly dilated.  Pericardium: There is no evidence of pericardial effusion.  Mitral Valve: The mitral valve is normal in structure. Trivial mitral valve regurgitation. No evidence of mitral valve stenosis.  Tricuspid Valve: The tricuspid valve is normal in structure. Tricuspid valve regurgitation is trivial. No evidence of tricuspid stenosis.  Aortic Valve: The aortic valve is normal in structure. Aortic valve regurgitation is not visualized. No aortic stenosis is present. Aortic valve mean gradient measures 2.0 mmHg. Aortic valve peak gradient measures 3.7 mmHg. Aortic valve area, by VTI measures 3.90 cm.  Pulmonic Valve: The pulmonic valve was normal in structure. Pulmonic valve regurgitation is trivial. No evidence of pulmonic stenosis.  Aorta: The aortic root is normal in size and structure.  Venous: The inferior vena cava is normal in size with greater than 50% respiratory variability, suggesting right atrial pressure of 3 mmHg.  IAS/Shunts: No atrial level shunt detected by color flow Doppler.   LEFT VENTRICLE PLAX 2D LVIDd:         4.50 cm   Diastology LVIDs:         2.80 cm   LV e' medial:    11.10 cm/s LV PW:          1.40 cm   LV E/e' medial:  5.8 LV IVS:        1.20 cm   LV e' lateral:   13.70 cm/s LVOT diam:     2.40 cm   LV E/e' lateral: 4.7 LV SV:         84 LV SV Index:   40 LVOT Area:     4.52 cm   RIGHT VENTRICLE RV Basal diam:  3.30 cm RV S prime:     12.60 cm/s TAPSE (M-mode): 2.8 cm  LEFT ATRIUM             Index        RIGHT ATRIUM           Index LA diam:        3.30 cm 1.58 cm/m   RA Area:     20.10  cm LA Vol (A2C):   81.2 ml 38.86 ml/m  RA Volume:   58.30 ml  27.90 ml/m LA Vol (A4C):   40.9 ml 19.57 ml/m LA Biplane Vol: 59.0 ml 28.24 ml/m AORTIC VALVE AV Area (Vmax):    4.00 cm AV Area (Vmean):   3.93 cm AV Area (VTI):     3.90 cm AV Vmax:           95.80 cm/s AV Vmean:          62.700 cm/s AV VTI:            0.216 m AV Peak Grad:      3.7 mmHg AV Mean Grad:      2.0 mmHg LVOT Vmax:         84.60 cm/s LVOT Vmean:        54.400 cm/s LVOT VTI:          0.186 m LVOT/AV VTI ratio: 0.86  AORTA Ao Root diam: 3.20 cm Ao Asc diam:  2.80 cm  MITRAL VALVE MV Area (PHT): 3.43 cm    SHUNTS MV Decel Time: 221 msec    Systemic VTI:  0.19 m MV E velocity: 64.60 cm/s  Systemic Diam: 2.40 cm MV A velocity: 43.70 cm/s MV E/A ratio:  1.48  Arvilla Meresaniel Bensimhon MD Electronically signed by Arvilla Meresaniel Bensimhon MD Signature Date/Time: 02/01/2022/9:48:54 AM    Final              Risk Assessment/Calculations:   {Does this patient have ATRIAL FIBRILLATION?[phone]} No BP recorded.  {Refresh Note OR Click here to enter BP  :1}***        Physical Exam:   VS:  There were no vitals taken for this visit.   Wt Readings from Last 3 Encounters:  02/01/22 211 lb 12.8 oz (96.1 kg)  11/11/21 205 lb 6.4 oz (93.2 kg)  08/11/21 201 lb 12.8 oz (91.5 kg)     GEN: Well nourished, well developed in no acute distress NECK: No JVD; No carotid bruits CARDIAC: ***RRR, no murmurs, rubs, gallops RESPIRATORY:  Clear to auscultation without rales, wheezing or rhonchi  ABDOMEN: Soft,  non-tender, non-distended EXTREMITIES:  No edema; No deformity   ASSESSMENT AND PLAN:   #Chronic Systolic HF with Improved EF: -TTE 03/2021 with EF 20-25% in the setting of anterior STEMI and cocaine abuse -LHC with proximal LAD stenosis s/p DES -Repeat TTE 01/2022 with EF 60-65%  -Currently with NYHA class II symptoms -Continue entresto 24-26mg  BID -Continue spiro 25mg  daily -Continue farxiga 10mg  daily -Not on BB due to bradycardia and cocaine abuse  #CAD: - S/p PCI to prox LAD in 03/2021 - Continue atorvastatin 80 mg daily. - Change brilinta to plavix 75mg  daily - Continue ASA 81 mg daily - Continue zetia 10mg  daily  #Tobacco use: - Encouraged cessation  #Cocaine Abuse: - Likely major contributor to underlying cardiomyopathy - Discussed importance of cessation    {Are you ordering a CV Procedure (e.g. stress test, cath, DCCV, TEE, etc)?   Press F2        :[id number]}   Signed, Meriam SpragueHeather E Nkosi Cortright, MD

## 2022-07-11 ENCOUNTER — Ambulatory Visit: Payer: Medicaid Other | Admitting: Cardiology

## 2022-07-27 DIAGNOSIS — Z419 Encounter for procedure for purposes other than remedying health state, unspecified: Secondary | ICD-10-CM | POA: Diagnosis not present

## 2022-08-27 DIAGNOSIS — Z419 Encounter for procedure for purposes other than remedying health state, unspecified: Secondary | ICD-10-CM | POA: Diagnosis not present

## 2022-08-30 ENCOUNTER — Encounter: Payer: Self-pay | Admitting: Interventional Cardiology

## 2022-08-30 ENCOUNTER — Ambulatory Visit: Payer: Medicaid Other | Attending: Internal Medicine | Admitting: Interventional Cardiology

## 2022-09-17 DIAGNOSIS — I498 Other specified cardiac arrhythmias: Secondary | ICD-10-CM | POA: Diagnosis not present

## 2022-09-17 DIAGNOSIS — F1410 Cocaine abuse, uncomplicated: Secondary | ICD-10-CM | POA: Diagnosis not present

## 2022-09-17 DIAGNOSIS — F17210 Nicotine dependence, cigarettes, uncomplicated: Secondary | ICD-10-CM | POA: Diagnosis not present

## 2022-09-17 DIAGNOSIS — R079 Chest pain, unspecified: Secondary | ICD-10-CM | POA: Diagnosis not present

## 2022-09-17 DIAGNOSIS — R002 Palpitations: Secondary | ICD-10-CM | POA: Diagnosis not present

## 2022-09-17 DIAGNOSIS — F1210 Cannabis abuse, uncomplicated: Secondary | ICD-10-CM | POA: Diagnosis not present

## 2022-09-26 DIAGNOSIS — Z419 Encounter for procedure for purposes other than remedying health state, unspecified: Secondary | ICD-10-CM | POA: Diagnosis not present

## 2022-10-27 DIAGNOSIS — Z419 Encounter for procedure for purposes other than remedying health state, unspecified: Secondary | ICD-10-CM | POA: Diagnosis not present

## 2022-11-06 ENCOUNTER — Encounter: Payer: Self-pay | Admitting: Cardiovascular Disease

## 2022-11-06 NOTE — Progress Notes (Unsigned)
  Cardiology Office Note:  .   Date:  11/07/2022  ID:  [name], DOB November 02, 1980, MRN [id number] PCP: Patient, No Pcp Per  [facility] Providers Cardiologist:  previous patient of H. Katrinka Blazing,  Bensimhon, new to [employer] to update primary MD,subspecialty MD or APP then REFRESH:1}   History of Present Illness: .   Joe Miranda is a 42 y.o. male with hx of asthma,cocaine use, CAD Had an out of hospital arrest and was admitted Apr 13, 2021 Cath shoed prox LAD stenosis, had DES to prox LAD Echo  ( Jan. 2023) showed severe LV dysfunction with EF < 25%. Echo ( May, 2023 and Nov. 2023 ) shows normal LV function with EF 60-65% On entresto 24-26 BID Farxiga 10 mg a day  Spironolactone 25 mg a day   No CP , no dyspnea  Walks regularly   Is a Paediatric nurse Tries to avoid salt foods.   ROS:    Studies Reviewed: Marland Kitchen   EKG Interpretation Date/Time:  Monday November 07 2022 15:54:51 EDT Ventricular Rate:  74 PR Interval:  126 QRS Duration:  96 QT Interval:  394 QTC Calculation: 437 R Axis:   92  Text Interpretation: Normal sinus rhythm Rightward axis Incomplete right bundle branch block Septal infarct , age undetermined When compared with ECG of 01-Feb-2022 10:02, Incomplete right bundle branch block is now Present ECG is essentially unchanged from previous ECGs Confirmed by Kristeen Miss [phone]) on 11/07/2022 5:15:24 PM    ECG :  EKG Interpretation Date/Time:  Monday November 07 2022 15:54:51 EDT Ventricular Rate:  74 PR Interval:  126 QRS Duration:  96 QT Interval:  394 QTC Calculation: 437 R Axis:   92  Text Interpretation: Normal sinus rhythm Rightward axis Incomplete right bundle branch block Septal infarct , age undetermined When compared with ECG of 01-Feb-2022 10:02, Incomplete right bundle branch block is now Present ECG is essentially unchanged from previous ECGs Confirmed by Kristeen Miss ([id number]) on 11/07/2022 5:15:24 PM   Risk Assessment/Calculations:              Physical Exam:   VS:  BP 130/80   Pulse 74   Ht 5\' 9"  (1.753 m)   Wt 210 lb (95.3 kg)   SpO2 99%   BMI 31.01 kg/m    Wt Readings from Last 3 Encounters:  11/07/22 210 lb (95.3 kg)  02/01/22 211 lb 12.8 oz (96.1 kg)  11/11/21 205 lb 6.4 oz (93.2 kg)    GEN: Well nourished, well developed in no acute distress NECK: No JVD; No carotid bruits CARDIAC: RRR, no murmurs, rubs, gallops, hyperdynamic heart sounds  RESPIRATORY:  Clear to auscultation without rales, wheezing or rhonchi  ABDOMEN: Soft, non-tender, non-distended EXTREMITIES:  No edema; No deformity   ASSESSMENT AND PLAN: .   1.  Coronary artery disease: He is not having any episodes of chest pain.  Will reduce his dose of Brilinta to 60 mg twice a day.  Will check lipids, basic metabolic profile today.   2.  History of chronic combined systolic and diastolic congestive heart failure: He has significant LV dysfunction apical myocardial infarction.  He is now made significant improvement.  His last echocardiogram following his large anterior in November, 2023 showed normal left ventricular systolic function.  Will repeat his echocardiogram to make sure that his LV is staying stable.      Dispo: 6 months with APP   Signed, Kristeen Miss, MD

## 2022-11-07 ENCOUNTER — Ambulatory Visit: Payer: Medicaid Other | Attending: Internal Medicine | Admitting: Cardiovascular Disease

## 2022-11-07 ENCOUNTER — Encounter: Payer: Self-pay | Admitting: Cardiovascular Disease

## 2022-11-07 VITALS — BP 130/80 | HR 74 | Ht 69.0 in | Wt 210.0 lb

## 2022-11-07 DIAGNOSIS — Z9861 Coronary angioplasty status: Secondary | ICD-10-CM

## 2022-11-07 DIAGNOSIS — I5020 Unspecified systolic (congestive) heart failure: Secondary | ICD-10-CM

## 2022-11-07 DIAGNOSIS — I5042 Chronic combined systolic (congestive) and diastolic (congestive) heart failure: Secondary | ICD-10-CM | POA: Diagnosis not present

## 2022-11-07 DIAGNOSIS — I2510 Atherosclerotic heart disease of native coronary artery without angina pectoris: Secondary | ICD-10-CM | POA: Diagnosis not present

## 2022-11-07 MED ORDER — TICAGRELOR 60 MG PO TABS
60.0000 mg | ORAL_TABLET | Freq: Two times a day (BID) | ORAL | 11 refills | Status: AC
Start: 2022-11-07 — End: ?

## 2022-11-07 NOTE — Patient Instructions (Addendum)
Medication Instructions:  DECREASE Brilinta to 60mg  twice daily  LABS: Lipids, ALT, BMET today  Testing/Procedures: ECHO Your physician has requested that you have an echocardiogram. Echocardiography is a painless test that uses sound waves to create images of your heart. It provides your doctor with information about the size and shape of your heart and how well your heart's chambers and valves are working. This procedure takes approximately one hour. There are no restrictions for this procedure. Please do NOT wear cologne, perfume, aftershave, or lotions (deodorant is allowed). Please arrive 15 minutes prior to your appointment time.  Follow-Up: At [facility], you and your health needs are our priority.  As part of our continuing mission to provide you with exceptional heart care, we have created designated Provider Care Teams.  These Care Teams include your primary Cardiologist (physician) and Advanced Practice Providers (APPs -  Physician Assistants and Nurse Practitioners) who all work together to provide you with the care you need, when you need it.  Your next appointment:   6 month(s)  Provider:   Phoebe Sharps

## 2022-11-18 ENCOUNTER — Encounter: Payer: Self-pay | Admitting: *Deleted

## 2022-11-22 ENCOUNTER — Ambulatory Visit (HOSPITAL_COMMUNITY): Payer: Medicaid Other

## 2022-11-27 DIAGNOSIS — Z419 Encounter for procedure for purposes other than remedying health state, unspecified: Secondary | ICD-10-CM | POA: Diagnosis not present

## 2023-01-09 ENCOUNTER — Ambulatory Visit (HOSPITAL_COMMUNITY): Payer: Medicaid Other | Attending: Cardiovascular Disease

## 2023-01-09 DIAGNOSIS — I5020 Unspecified systolic (congestive) heart failure: Secondary | ICD-10-CM | POA: Diagnosis not present

## 2023-01-09 DIAGNOSIS — R079 Chest pain, unspecified: Secondary | ICD-10-CM | POA: Diagnosis not present

## 2023-01-09 DIAGNOSIS — Z9861 Coronary angioplasty status: Secondary | ICD-10-CM | POA: Insufficient documentation

## 2023-01-09 DIAGNOSIS — F17200 Nicotine dependence, unspecified, uncomplicated: Secondary | ICD-10-CM | POA: Insufficient documentation

## 2023-01-09 DIAGNOSIS — I2510 Atherosclerotic heart disease of native coronary artery without angina pectoris: Secondary | ICD-10-CM | POA: Insufficient documentation

## 2023-01-09 DIAGNOSIS — R0602 Shortness of breath: Secondary | ICD-10-CM | POA: Diagnosis not present

## 2023-01-09 DIAGNOSIS — I5042 Chronic combined systolic (congestive) and diastolic (congestive) heart failure: Secondary | ICD-10-CM | POA: Diagnosis not present

## 2023-01-09 LAB — ECHOCARDIOGRAM COMPLETE
Area-P 1/2: 4.64 cm2
S' Lateral: 3.3 cm

## 2023-01-27 DIAGNOSIS — Z419 Encounter for procedure for purposes other than remedying health state, unspecified: Secondary | ICD-10-CM | POA: Diagnosis not present

## 2023-01-31 DIAGNOSIS — L03011 Cellulitis of right finger: Secondary | ICD-10-CM | POA: Diagnosis not present

## 2023-01-31 DIAGNOSIS — M7989 Other specified soft tissue disorders: Secondary | ICD-10-CM | POA: Diagnosis not present

## 2023-02-01 DIAGNOSIS — L03011 Cellulitis of right finger: Secondary | ICD-10-CM | POA: Diagnosis not present

## 2023-02-26 DIAGNOSIS — Z419 Encounter for procedure for purposes other than remedying health state, unspecified: Secondary | ICD-10-CM | POA: Diagnosis not present

## 2023-03-29 DIAGNOSIS — Z419 Encounter for procedure for purposes other than remedying health state, unspecified: Secondary | ICD-10-CM | POA: Diagnosis not present

## 2023-04-29 DIAGNOSIS — Z419 Encounter for procedure for purposes other than remedying health state, unspecified: Secondary | ICD-10-CM | POA: Diagnosis not present

## 2023-05-27 DIAGNOSIS — Z419 Encounter for procedure for purposes other than remedying health state, unspecified: Secondary | ICD-10-CM | POA: Diagnosis not present

## 2023-06-04 IMAGING — CT CT HEAD W/O CM
2 of 3 series · 13 of 40 positions shown, 16 images · non-contrast
Comparison: None.

CLINICAL DATA: Facial trauma, blunt.



[Series 5: head 2.0 mpr ax · axial · 0.36mm/px · z∈[-292,-155]mm · 10 of 90 slices shown, 13 images]
[im 9/90  brain]
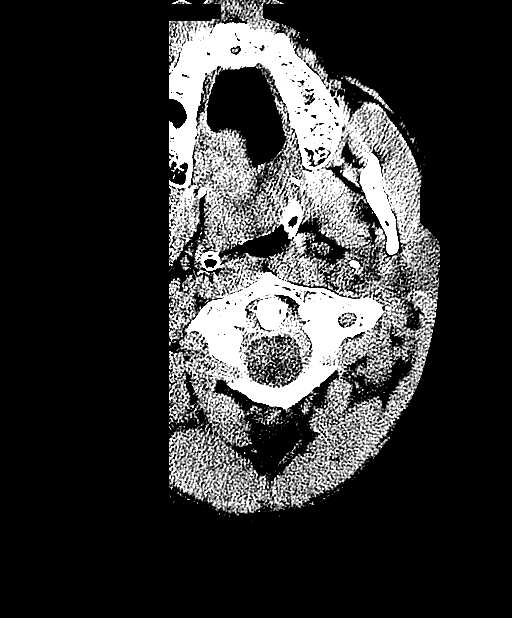
[im 9/90  bone]
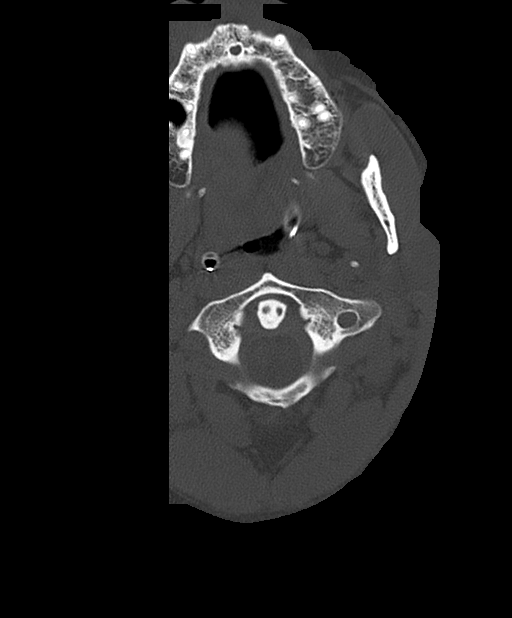
[im 17/90  brain]
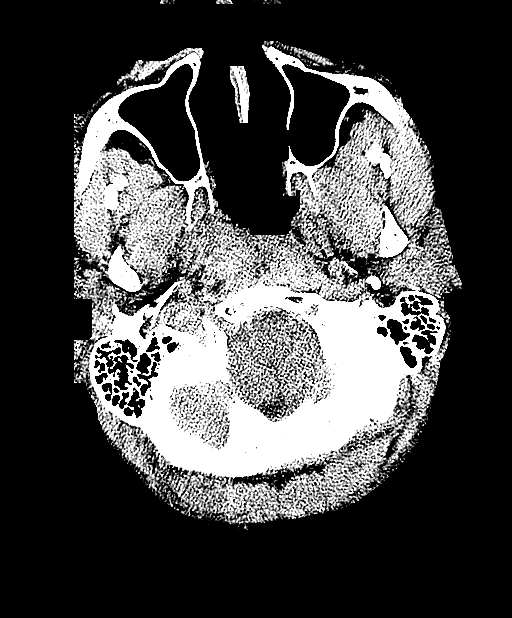
[im 26/90  brain]
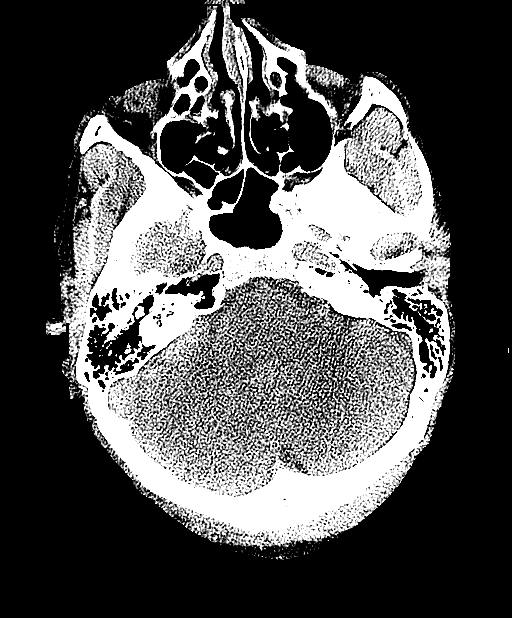
[im 34/90  brain]
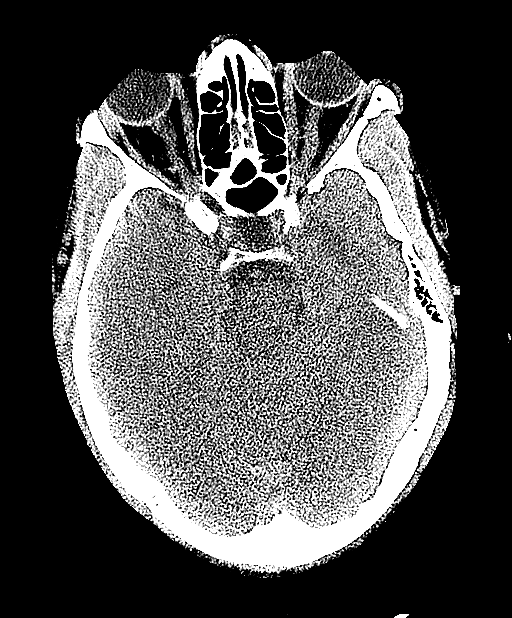
[im 43/90  brain]
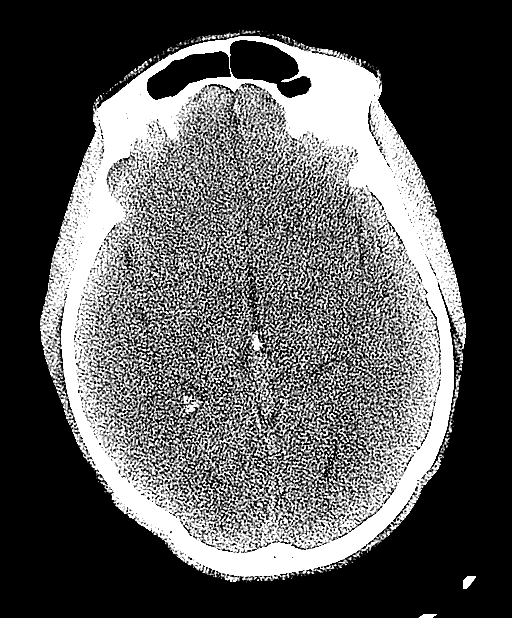
[im 43/90  bone]
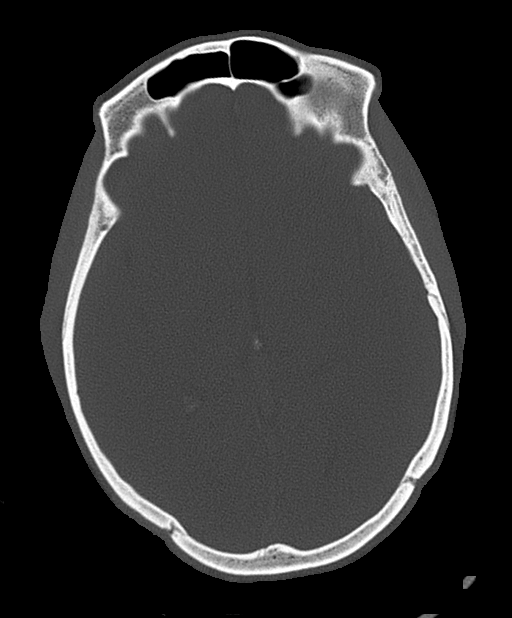
[im 51/90  brain]
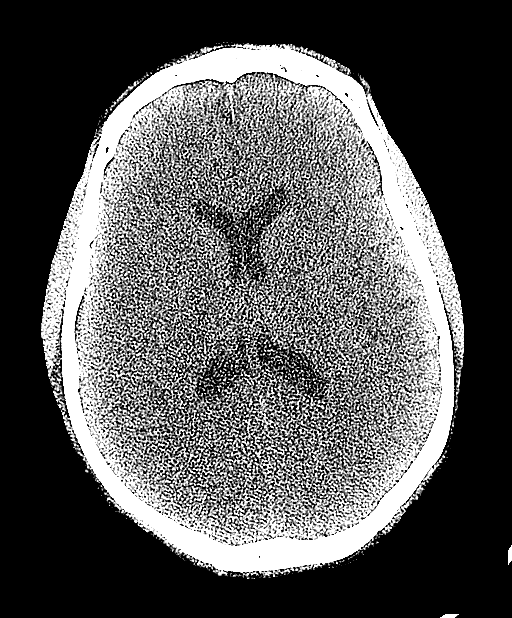
[im 60/90  brain]
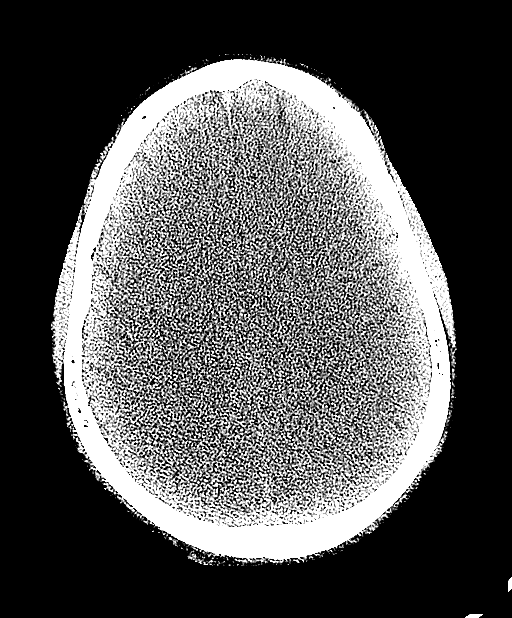
[im 68/90  brain]
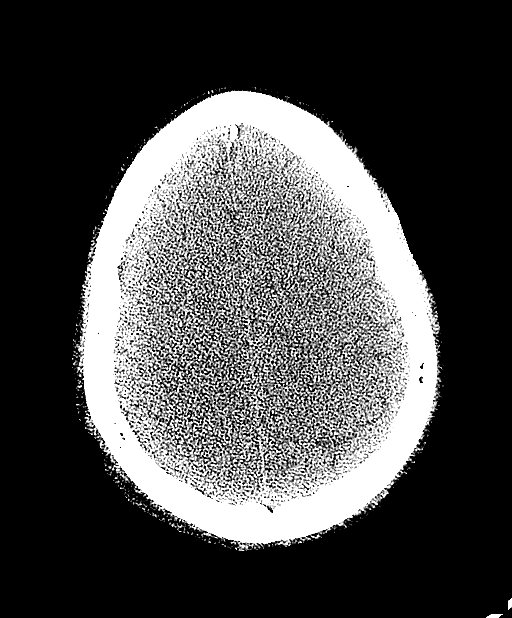
[im 77/90  brain]
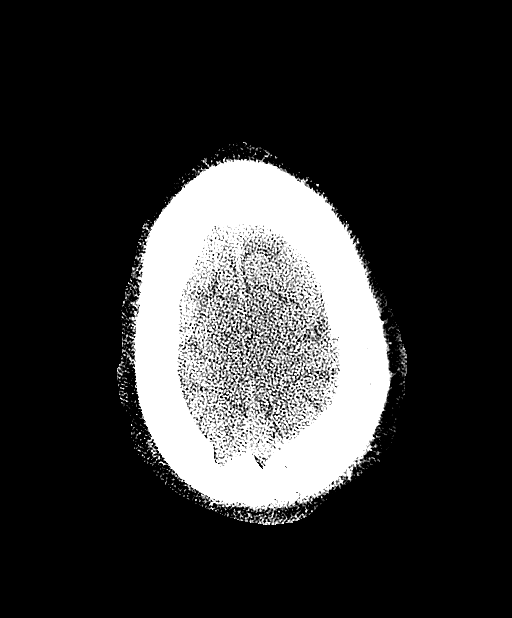
[im 77/90  bone]
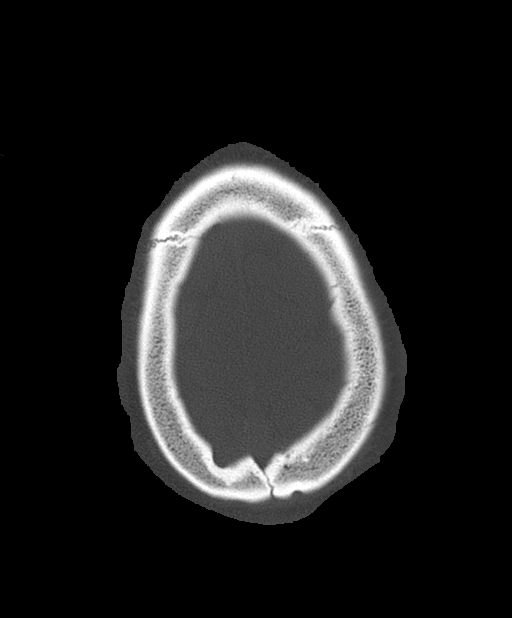
[im 85/90  brain]
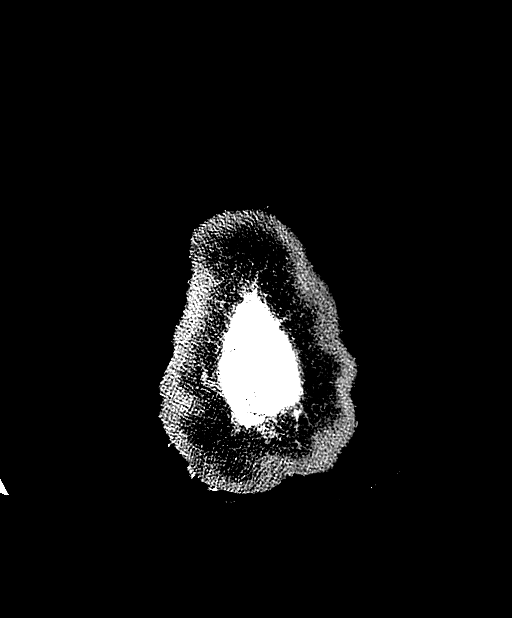

[Series 6: head 3.0 mpr cor · coronal · 0.34mm/px · 3 of 72 slices shown]
[im 24/72  brain]
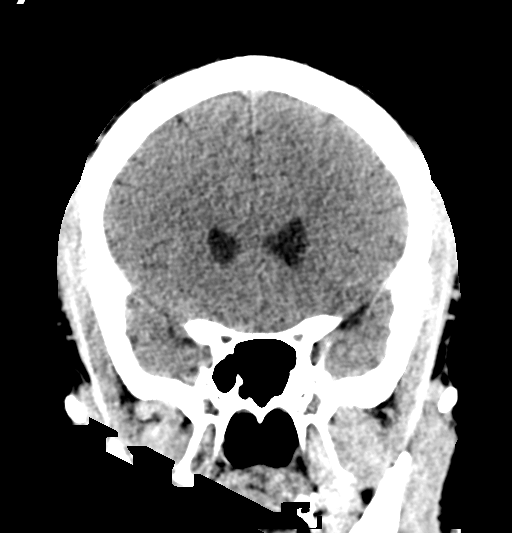
[im 32/72  brain]
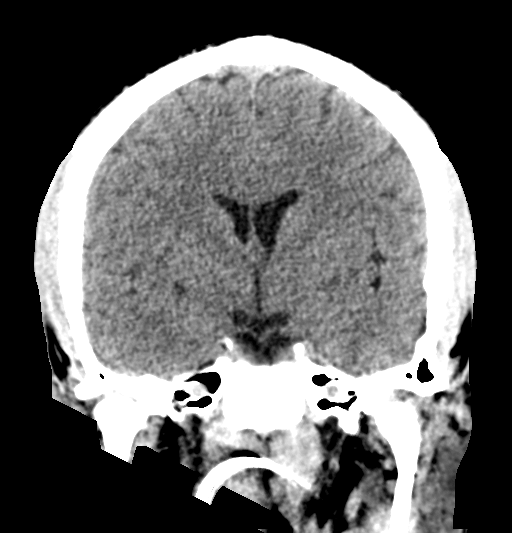
[im 40/72  brain]
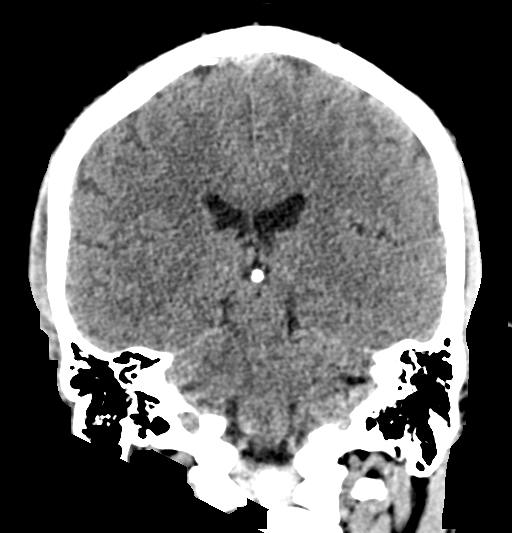

[13 of 40 positions shown; findings below may reference images not displayed]

FINDINGS: Brain: No acute intracranial hemorrhage, midline shift or mass
effect. No extra-axial fluid collection. Gray-white matter
differentiation is within normal limits. No hydrocephalus.

Vascular: No hyperdense vessel or unexpected calcification.

Skull: Normal. Negative for fracture or focal lesion.

Sinuses/Orbits: No acute finding.

Other: Tubes are present in the oropharynx.
IMPRESSION: No acute intracranial process.

## 2023-06-04 IMAGING — DX DG CHEST 1V PORT
1 series · 1 of 1 positions shown · non-contrast
Comparison: Chest x-ray 03/02/2013.

CLINICAL DATA: Post CPR.

EXAM:
PORTABLE CHEST 1 VIEW

[chest ap]
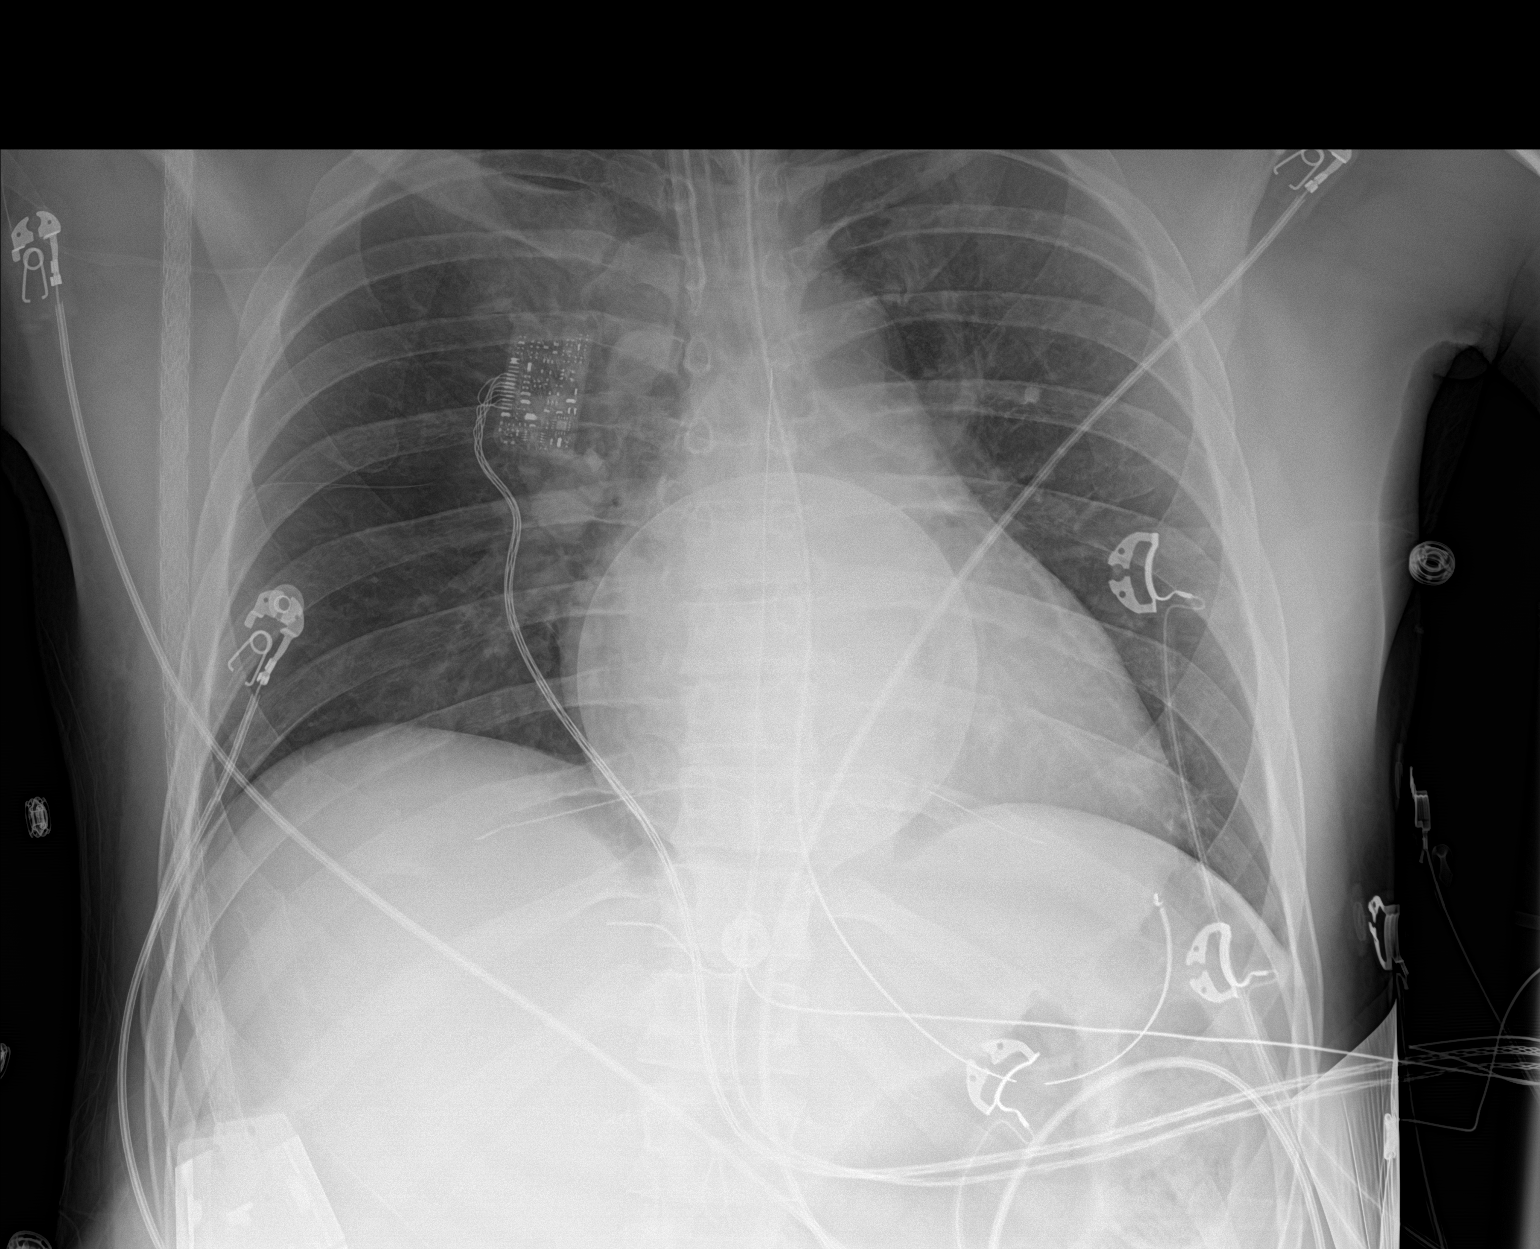

[1 of 1 positions shown; findings below may reference images not displayed]

FINDINGS: Endotracheal tube tip is 2.5 cm above the carina. Enteric tube tip
is in the mid stomach. Lines and tubes overlie the chest.

The cardiomediastinal silhouette is within normal limits for
projection. There is airspace disease in the medial right upper
lung. Costophrenic angles are clear. No pneumothorax. No acute
fractures are identified.
IMPRESSION: 1. Medial right upper lobe airspace disease.
2. Lines and tubes as above.

## 2023-07-08 DIAGNOSIS — Z419 Encounter for procedure for purposes other than remedying health state, unspecified: Secondary | ICD-10-CM | POA: Diagnosis not present

## 2023-08-07 DIAGNOSIS — Z419 Encounter for procedure for purposes other than remedying health state, unspecified: Secondary | ICD-10-CM | POA: Diagnosis not present

## 2023-09-07 DIAGNOSIS — Z419 Encounter for procedure for purposes other than remedying health state, unspecified: Secondary | ICD-10-CM | POA: Diagnosis not present

## 2023-10-07 DIAGNOSIS — Z419 Encounter for procedure for purposes other than remedying health state, unspecified: Secondary | ICD-10-CM | POA: Diagnosis not present

## 2023-11-07 DIAGNOSIS — Z419 Encounter for procedure for purposes other than remedying health state, unspecified: Secondary | ICD-10-CM | POA: Diagnosis not present

## 2023-12-08 DIAGNOSIS — Z419 Encounter for procedure for purposes other than remedying health state, unspecified: Secondary | ICD-10-CM | POA: Diagnosis not present
